# Patient Record
Sex: Female | Born: 1950 | Race: White | Hispanic: No | Marital: Married | Smoking: Never smoker
Health system: Southern US, Community
[De-identification: ages and names within clinical notes are randomized; demographics above are authoritative.]

## PROBLEM LIST (undated history)

## (undated) DIAGNOSIS — Q632 Ectopic kidney: Secondary | ICD-10-CM

## (undated) DIAGNOSIS — N926 Irregular menstruation, unspecified: Secondary | ICD-10-CM

## (undated) DIAGNOSIS — D649 Anemia, unspecified: Secondary | ICD-10-CM

## (undated) DIAGNOSIS — S0300XA Dislocation of jaw, unspecified side, initial encounter: Secondary | ICD-10-CM

## (undated) DIAGNOSIS — N6009 Solitary cyst of unspecified breast: Secondary | ICD-10-CM

## (undated) DIAGNOSIS — Z973 Presence of spectacles and contact lenses: Secondary | ICD-10-CM

## (undated) DIAGNOSIS — M8580 Other specified disorders of bone density and structure, unspecified site: Secondary | ICD-10-CM

## (undated) HISTORY — DX: Dislocation of jaw, unspecified side, initial encounter: S03.00XA

## (undated) HISTORY — DX: Anemia, unspecified: D64.9

## (undated) HISTORY — DX: Other specified disorders of bone density and structure, unspecified site: M85.80

## (undated) HISTORY — DX: Irregular menstruation, unspecified: N92.6

## (undated) HISTORY — DX: Ectopic kidney: Q63.2

## (undated) HISTORY — PX: COLONOSCOPY: SHX174

## (undated) HISTORY — DX: Solitary cyst of unspecified breast: N60.09

---

## 1987-08-09 DIAGNOSIS — S0300XA Dislocation of jaw, unspecified side, initial encounter: Secondary | ICD-10-CM

## 1987-08-09 HISTORY — PX: TEMPOROMANDIBULAR JOINT ARTHROPLASTY: SUR76

## 1987-08-09 HISTORY — DX: Dislocation of jaw, unspecified side, initial encounter: S03.00XA

## 1994-01-06 DIAGNOSIS — D649 Anemia, unspecified: Secondary | ICD-10-CM

## 1994-01-06 HISTORY — DX: Anemia, unspecified: D64.9

## 1997-04-08 DIAGNOSIS — N6009 Solitary cyst of unspecified breast: Secondary | ICD-10-CM

## 1997-04-08 HISTORY — DX: Solitary cyst of unspecified breast: N60.09

## 1998-06-01 ENCOUNTER — Other Ambulatory Visit: Admission: RE | Admit: 1998-06-01 | Discharge: 1998-06-01 | Payer: Self-pay | Admitting: Obstetrics and Gynecology

## 1999-06-17 ENCOUNTER — Other Ambulatory Visit: Admission: RE | Admit: 1999-06-17 | Discharge: 1999-06-17 | Payer: Self-pay | Admitting: Obstetrics and Gynecology

## 2000-06-26 ENCOUNTER — Other Ambulatory Visit: Admission: RE | Admit: 2000-06-26 | Discharge: 2000-06-26 | Payer: Self-pay | Admitting: Obstetrics and Gynecology

## 2001-07-02 ENCOUNTER — Other Ambulatory Visit: Admission: RE | Admit: 2001-07-02 | Discharge: 2001-07-02 | Payer: Self-pay | Admitting: Obstetrics and Gynecology

## 2002-07-01 ENCOUNTER — Other Ambulatory Visit: Admission: RE | Admit: 2002-07-01 | Discharge: 2002-07-01 | Payer: Self-pay | Admitting: Obstetrics and Gynecology

## 2003-07-07 ENCOUNTER — Other Ambulatory Visit: Admission: RE | Admit: 2003-07-07 | Discharge: 2003-07-07 | Payer: Self-pay | Admitting: Obstetrics and Gynecology

## 2003-09-08 ENCOUNTER — Ambulatory Visit (HOSPITAL_COMMUNITY): Admission: RE | Admit: 2003-09-08 | Discharge: 2003-09-08 | Payer: Self-pay | Admitting: Gastroenterology

## 2004-07-08 ENCOUNTER — Other Ambulatory Visit: Admission: RE | Admit: 2004-07-08 | Discharge: 2004-07-08 | Payer: Self-pay | Admitting: Obstetrics and Gynecology

## 2005-07-12 ENCOUNTER — Other Ambulatory Visit: Admission: RE | Admit: 2005-07-12 | Discharge: 2005-07-12 | Payer: Self-pay | Admitting: Obstetrics and Gynecology

## 2006-02-23 ENCOUNTER — Encounter: Admission: RE | Admit: 2006-02-23 | Discharge: 2006-03-30 | Payer: Self-pay | Admitting: Family Medicine

## 2006-05-17 ENCOUNTER — Ambulatory Visit (HOSPITAL_BASED_OUTPATIENT_CLINIC_OR_DEPARTMENT_OTHER): Admission: RE | Admit: 2006-05-17 | Discharge: 2006-05-17 | Payer: Self-pay | Admitting: General Surgery

## 2006-05-17 ENCOUNTER — Encounter (INDEPENDENT_AMBULATORY_CARE_PROVIDER_SITE_OTHER): Payer: Self-pay | Admitting: *Deleted

## 2006-07-13 ENCOUNTER — Other Ambulatory Visit: Admission: RE | Admit: 2006-07-13 | Discharge: 2006-07-13 | Payer: Self-pay | Admitting: Obstetrics & Gynecology

## 2007-07-16 ENCOUNTER — Other Ambulatory Visit: Admission: RE | Admit: 2007-07-16 | Discharge: 2007-07-16 | Payer: Self-pay | Admitting: Obstetrics and Gynecology

## 2008-07-08 DIAGNOSIS — M8580 Other specified disorders of bone density and structure, unspecified site: Secondary | ICD-10-CM

## 2008-07-08 HISTORY — DX: Other specified disorders of bone density and structure, unspecified site: M85.80

## 2008-07-16 ENCOUNTER — Other Ambulatory Visit: Admission: RE | Admit: 2008-07-16 | Discharge: 2008-07-16 | Payer: Self-pay | Admitting: Obstetrics and Gynecology

## 2010-12-24 NOTE — Op Note (Signed)
NAME:  Williams, Sara                         ACCOUNT NO.:  [id number]   MEDICAL RECORD NO.:  [id number]                   PATIENT TYPE:  AMB   LOCATION:  ENDO                                 FACILITY:  [facility]   PHYSICIAN:  Petra Kuba, M.D.                 DATE OF BIRTH:  10-03-1950   DATE OF PROCEDURE:  09/08/2003  DATE OF DISCHARGE:                                 OPERATIVE REPORT   PROCEDURE:  Colonoscopy.   ENDOSCOPIST:  Petra Kuba, M.D.   INDICATIONS FOR PROCEDURE:  A patient with abdominal pain, abnormal loop on  small bowel series.  Due for a colonic screening.   INFORMED CONSENT:  A consent was signed after the risks, benefits, methods  and options were thoroughly discussed in the office.   MEDICINES USED:  Demerol 75 mg, Versed 6 mg.   DESCRIPTION OF PROCEDURE:  A rectal inspection was pertinent for external  hemorrhoids, small.  A digital exam is negative.  The pediatric video  adjustable colonoscope was inserted and easily advanced around the colon to  the cecum.  This did require some abdominal pressure, mostly on her right  side, but no position changes.  No obvious abnormalities were seen on  insertion.  The cecum was identified by the appendiceal orifice and the  ileocecal valve.  To advanced into the terminal ileum required rolling her  on her back.  The TI on brief evaluation was normal.  We did have trouble  maintaining adequate position there.  We elected to withdraw.  The prep was  adequate.  There was some liquid stool that required washing and suctioning,  but on slow withdrawal back to the rectum, no abnormalities were seen;  specifically, no polyps, tumors, masses, or diverticula.  Once back in the  rectum, anorectal posterior in retroflexion confirmed some small  hemorrhoids.  The scope was reinserted a short ways up the left side of the  colon.  Air was suctioned.  The scope was removed.  The patient tolerated the procedure well.  There was no  obvious immediate  complication.   ENDOSCOPIC ASSESSMENT:  1. Internal and external hemorrhoids.  2. Otherwise within normal limits to the terminal ileum.   PLAN:  Re-screen in five to 10 years.  A trial of Levbid.  I will see her  back in six weeks.  Probably needs a laparoscope next.  Will let her contact  Dr. Laqueta Linden to possibly set that up.  I will be happy to see her back  sooner p.r.n.  As an aside, she did have a red streak from her Demerol or  Versed with some minimal itching, but no frank rash or any wheezing or other  problems.  Petra Kuba, M.D.    MEM/MEDQ  D:  09/08/2003  T:  09/08/2003  Job:  [id number]   cc:   Vikki Ports, M.D.  [address]  [place]  [place] [address]  Fax: [phone]   Laqueta Linden, M.D.  [address]  [place]  [place] [address]  Fax: [phone]

## 2010-12-24 NOTE — Op Note (Signed)
NAME:  Sara Williams, Sara Williams             ACCOUNT NO.:  [id number]   MEDICAL RECORD NO.:  [id number]          PATIENT TYPE:  AMB   LOCATION:  DSC                          FACILITY:  [facility]   PHYSICIAN:  Ollen Gross. Vernell Morgans, M.D. DATE OF BIRTH:  12/01/1950   DATE OF PROCEDURE:  05/17/2006  DATE OF DISCHARGE:  05/17/2006                               OPERATIVE REPORT   PREOPERATIVE DIAGNOSIS:  Skin lesions with atypia in the right thigh and  left foot.   POSTOPERATIVE DIAGNOSIS:  Skin lesions with atypia in the right thigh  and left foot.   PROCEDURE:  Excision of lesions on the right thigh and left foot.   SURGEON:  Ollen Gross. Carolynne Edouard, M.D.   ANESTHESIA:  Local, with IV sedation.   PROCEDURE:  After informed consent was obtained, the patient was brought  to the operating room and placed in the supine position on the operating  table.  Once some IV sedation had been given, the patient's right thigh  and left foot were prepped with Betadine and draped in the usual sterile  manner.  Each of these areas was then infiltrated with 1% lidocaine with  epinephrine.  A vertically oriented elliptical incision was then made to  excise each of the old scars in these areas.  This was done full  thickness with a 15 blade knife down to the subcutaneous fat.  Each of  these lesions was then oriented with a stitch on the distal segment and  sent to pathology.  The right thigh wound was closed with a running 3-0  nylon stitch, and the left foot wound was closed with a running 4-0  nylon stitch.  Antibiotic ointment and sterile dressings were applied.  The patient tolerated the procedure well.  At the end of the case, all  needle, sponge, and instrument counts were correct.  The patient also  had a lesion in the right web space of the fourth and fifth toes which  was being excised by Dr. Orson Slick during the time that I was doing the  other two areas, and his portion of the case will be dictated  separately.  The  patient was then taken to the recovery room in stable  condition.      Ollen Gross. Vernell Morgans, M.D.  Electronically Signed     PST/MEDQ  D:  05/22/2006  T:  05/22/2006  Job:  [id number]

## 2011-10-14 LAB — HM MAMMOGRAPHY: HM Mammogram: NORMAL

## 2012-02-23 ENCOUNTER — Other Ambulatory Visit: Payer: Self-pay | Admitting: Dermatology

## 2012-11-14 ENCOUNTER — Other Ambulatory Visit: Payer: Self-pay | Admitting: Nurse Practitioner

## 2012-12-17 ENCOUNTER — Encounter: Payer: Self-pay | Admitting: *Deleted

## 2012-12-18 ENCOUNTER — Encounter: Payer: Self-pay | Admitting: Nurse Practitioner

## 2012-12-18 ENCOUNTER — Ambulatory Visit (INDEPENDENT_AMBULATORY_CARE_PROVIDER_SITE_OTHER): Payer: BC Managed Care – PPO | Admitting: Nurse Practitioner

## 2012-12-18 VITALS — BP 108/66 | HR 72 | Resp 12 | Ht 65.0 in | Wt 137.2 lb

## 2012-12-18 DIAGNOSIS — Z01419 Encounter for gynecological examination (general) (routine) without abnormal findings: Secondary | ICD-10-CM

## 2012-12-18 DIAGNOSIS — A499 Bacterial infection, unspecified: Secondary | ICD-10-CM

## 2012-12-18 DIAGNOSIS — B9689 Other specified bacterial agents as the cause of diseases classified elsewhere: Secondary | ICD-10-CM

## 2012-12-18 DIAGNOSIS — Z0000 Encounter for general adult medical examination without abnormal findings: Secondary | ICD-10-CM

## 2012-12-18 DIAGNOSIS — N760 Acute vaginitis: Secondary | ICD-10-CM

## 2012-12-18 LAB — POCT URINALYSIS DIPSTICK
Leukocytes, UA: NEGATIVE
Spec Grav, UA: 1.02
pH, UA: 6.5

## 2012-12-18 LAB — POCT WET PREP (WET MOUNT)

## 2012-12-18 MED ORDER — ESTRADIOL-NORETHINDRONE ACET 0.05-0.14 MG/DAY TD PTTW: 1.0000 | MEDICATED_PATCH | TRANSDERMAL | Status: DC

## 2012-12-18 MED ORDER — METRONIDAZOLE 0.75 % VA GEL: 1.0000 | Freq: Every day | VAGINAL | Status: DC

## 2012-12-18 NOTE — Patient Instructions (Addendum)

## 2012-12-18 NOTE — Progress Notes (Signed)
62 y.o. G3P3 Married Caucasian Fe here for annual exam.  She feels well except has noted an increase in vaso symptoms maybe for 2-3 months.  States not relationship to stressors.  She is very compliant to changing her hormone patch as she should.  She has also noted an increase of vaginal discharge that is yellow, 'sticky', without odor or discomfort on off for about 8-10 months.  No difference with sexually activity or exercise. No itching or burning.  Patient's last menstrual period was 08/20/2005.          Sexually active: yes  The current method of family planning is post menopausal status.    Exercising: yes  walking  Smoker:  no  Health Maintenance: Pap:  10/12/2011  Normal with negative HR HPV MMG:  10/14/11 normal next one is scheduled Colonoscopy:  02/2010 ? colitis Dr. Ewing Schlein recheck 5 -10 years BMD:   10/2010 T Score: spine -1.8; left hip neck-2.1 TDaP: 09/15/2010 Labs: Hgb-12.5   reports that she has never smoked. She has never used smokeless tobacco. She reports that she does not drink alcohol or use illicit drugs.  Past Medical History  Diagnosis Date  . Anemia 01/1994  . Breast cyst   . Irregular menses   . Pelvic kidney     right sided  . Osteoporosis   . TMJ (dislocation of temporomandibular joint)     Past Surgical History  Procedure Laterality Date  . Colonoscopy      Current Outpatient Prescriptions  Medication Sig Dispense Refill  . COMBIPATCH 0.05-0.14 MG/DAY Place 0.05 patches onto the skin.      . Nutritional Supplements (JUICE PLUS FIBRE PO) Take by mouth daily.       No current facility-administered medications for this visit.    History reviewed. No pertinent family history.  ROS:  Pertinent items are noted in HPI.  Otherwise, a comprehensive ROS was negative.  Exam:   BP 108/66  Pulse 72  Resp 12  Ht 5\' 5"  (1.651 m)  Wt 137 lb 3.2 oz (62.234 kg)  BMI 22.83 kg/m2  LMP 08/20/2005 Height: 5\' 5"  (165.1 cm)  Ht Readings from Last 3 Encounters:   12/18/12 5\' 5"  (1.651 m)    General appearance: alert, cooperative and appears stated age Head: Normocephalic, without obvious abnormality, atraumatic Neck: no adenopathy, supple, symmetrical, trachea midline and thyroid normal to inspection and palpation Lungs: clear to auscultation bilaterally Breasts: normal appearance, no masses or tenderness Heart: regular rate and rhythm Abdomen: soft, non-tender; no masses,  no organomegaly Extremities: extremities normal, atraumatic, no cyanosis or edema Skin: Skin color, texture, turgor normal. No rashes or lesions Lymph nodes: Cervical, supraclavicular, and axillary nodes normal. No abnormal inguinal nodes palpated Neurologic: Grossly normal   Pelvic: External genitalia:  no lesions              Urethra:  normal appearing urethra with no masses, tenderness or lesions              Bartholin's and Skene's: normal                 Vagina: normal appearing vagina with normal color and thin yellow discharge, no lesions              Cervix: anteverted              Pap taken: no  Wet prep: + clue cells, Ph 5.5 Bimanual Exam:  Uterus:  normal size, contour, position, consistency, mobility, non-tender  Adnexa: no mass, fullness, tenderness               Rectovaginal: Confirms               Anus:  normal sphincter tone, no lesions  A:  Well Woman with normal exam  Postmenopausal on HRT  Increase in vaso symptoms  Bacterial Vaginitis  Osteopenia    P:   Pap smear as per guidelines   Mammogram as scheduled  Patient will follow with PCP about rechecking TSH and glucose  Refill on Combipatch for 1 year  Discussed rationale for HRT, risk and benefits including DVT, CVA, heart, and cancer risk, patient feels  she wants to continue for quality of life issues  RX: Metrogel for BV, if symptoms not better to call back  counseled on adequate intake of calcium and vitamin D, diet and exercise  return annually or prn  An After Visit  Summary was printed and given to the patient.

## 2012-12-19 NOTE — Progress Notes (Signed)
Encounter reviewed by Dr. Finley Chevez Silva.  

## 2013-06-17 ENCOUNTER — Encounter: Payer: Self-pay | Admitting: Nurse Practitioner

## 2013-06-17 ENCOUNTER — Ambulatory Visit (INDEPENDENT_AMBULATORY_CARE_PROVIDER_SITE_OTHER): Payer: BC Managed Care – PPO | Admitting: Nurse Practitioner

## 2013-06-17 ENCOUNTER — Telehealth: Payer: Self-pay | Admitting: Nurse Practitioner

## 2013-06-17 VITALS — BP 94/60 | HR 72 | Temp 98.8°F | Ht 64.75 in | Wt 138.0 lb

## 2013-06-17 DIAGNOSIS — N760 Acute vaginitis: Secondary | ICD-10-CM

## 2013-06-17 DIAGNOSIS — N950 Postmenopausal bleeding: Secondary | ICD-10-CM

## 2013-06-17 DIAGNOSIS — A499 Bacterial infection, unspecified: Secondary | ICD-10-CM

## 2013-06-17 DIAGNOSIS — B9689 Other specified bacterial agents as the cause of diseases classified elsewhere: Secondary | ICD-10-CM

## 2013-06-17 DIAGNOSIS — N841 Polyp of cervix uteri: Secondary | ICD-10-CM

## 2013-06-17 MED ORDER — CLINDAMYCIN PHOSPHATE 2 % VA CREA: 1.0000 | TOPICAL_CREAM | Freq: Every day | VAGINAL | Status: DC

## 2013-06-17 NOTE — Progress Notes (Signed)
Subjective:     Patient ID: Sara Williams, female   DOB: July 21, 1951, 62 y.o.   MRN: [id number]  Vaginal Discharge The patient's primary symptoms include a vaginal discharge. The patient's pertinent negatives include no pelvic pain. Pertinent negatives include no abdominal pain, chills, constipation, diarrhea, dysuria, fever, flank pain, frequency, hematuria, nausea, urgency or vomiting.    This 62 yo WM Fe presents with vaginal discharge that is 'sticky'.  At her AEX in May she had similar symptoms and ws treated for BV with Metrogel.  Symptoms improved and was gone for about 2 weeks.  Then after a trip to care for her mother in June again had same symptoms and used there rest of the Metrogel.  Now symptoms again that is still bothersome.  States the discharge gets dried and forms a clump at the hair and causes her skin to stick to the panties.  After pulling the sticky stuff off feels like she has a fresh cut.  At a couple of times the sticky stuff in  color is brown tar like substance.  In July she did have spotting X 3 spots without missed or late use of Combi patch.   Usually no odor but a  month ago had an odor that is floral pungent smell.  No bladder symptoms.no changes in symptoms with sexual activity  Review of Systems  Constitutional: Negative for fever, chills and fatigue.  Respiratory: Negative.   Cardiovascular: Negative.   Gastrointestinal: Negative.  Negative for nausea, vomiting, abdominal pain, diarrhea, constipation, blood in stool, abdominal distention and anal bleeding.  Genitourinary: Positive for vaginal bleeding and vaginal discharge. Negative for dysuria, urgency, frequency, hematuria, flank pain, decreased urine volume, pelvic pain and dyspareunia.       On HRT since 06/2002 with current Combi patch 0.05/0.14 mg since 12/06.  Musculoskeletal: Negative.   Skin: Negative.   Neurological: Negative.   Psychiatric/Behavioral: Negative.        Objective:   Physical Exam   Constitutional: She is oriented to person, place, and time. She appears well-developed and well-nourished.  Abdominal: Soft. She exhibits no distension and no mass. There is no tenderness. There is no rebound and no guarding.  Genitourinary:  External irritation without specific lesions. There is a clump of dried brown discharge in the hair.  Vaginal discharge is yellow tinged without cottage cheese appearance.  After wiping several times at the cervix - there is a presence of an endocervical polyp about 5 mm in size.  No active bleeding. No pain on bimanual.  Wet prep:  KOH: negative, PH 5.0; NSS: + clue and WBC.  Neurological: She is alert and oriented to person, place, and time.  Psychiatric: She has a normal mood and affect. Her behavior is normal. Judgment and thought content normal.       Assessment:     BV Consider polyp as the cause of brown tar discharge and spotting in July Consider inflammatory vaginitis as cause.    Plan:     Will treat with Cleocin vaginal cream for 10 days She will then return and see Dr. Hyacinth Meeker to look at the cervical polyp, may need PUS to further evaluate.

## 2013-06-17 NOTE — Patient Instructions (Signed)
Bacterial Vaginosis Bacterial vaginosis (BV) is a vaginal infection where the normal balance of bacteria in the vagina is disrupted. The normal balance is then replaced by an overgrowth of certain bacteria. There are several different kinds of bacteria that can cause BV. BV is the most common vaginal infection in women of childbearing age. CAUSES   The cause of BV is not fully understood. BV develops when there is an increase or imbalance of harmful bacteria.  Some activities or behaviors can upset the normal balance of bacteria in the vagina and put women at increased risk including:  Having a new sex partner or multiple sex partners.  Douching.  Using an intrauterine device (IUD) for contraception.  It is not clear what role sexual activity plays in the development of BV. However, women that have never had sexual intercourse are rarely infected with BV. Women do not get BV from toilet seats, bedding, swimming pools or from touching objects around them.  SYMPTOMS   Grey vaginal discharge.  A fish-like odor with discharge, especially after sexual intercourse.  Itching or burning of the vagina and vulva.  Burning or pain with urination.  Some women have no signs or symptoms at all. DIAGNOSIS  Your caregiver must examine the vagina for signs of BV. Your caregiver will perform lab tests and look at the sample of vaginal fluid through a microscope. They will look for bacteria and abnormal cells (clue cells), a pH test higher than 4.5, and a positive amine test all associated with BV.  RISKS AND COMPLICATIONS   Pelvic inflammatory disease (PID).  Infections following gynecology surgery.  Developing HIV.  Developing herpes virus. TREATMENT  Sometimes BV will clear up without treatment. However, all women with symptoms of BV should be treated to avoid complications, especially if gynecology surgery is planned. Female partners generally do not need to be treated. However, BV may spread  between female sex partners so treatment is helpful in preventing a recurrence of BV.   BV may be treated with antibiotics. The antibiotics come in either pill or vaginal cream forms. Either can be used with nonpregnant or pregnant women, but the recommended dosages differ. These antibiotics are not harmful to the baby.  BV can recur after treatment. If this happens, a second round of antibiotics will often be prescribed.  Treatment is important for pregnant women. If not treated, BV can cause a premature delivery, especially for a pregnant woman who had a premature birth in the past. All pregnant women who have symptoms of BV should be checked and treated.  For chronic reoccurrence of BV, treatment with a type of prescribed gel vaginally twice a week is helpful. HOME CARE INSTRUCTIONS   Finish all medication as directed by your caregiver.  Do not have sex until treatment is completed.  Tell your sexual partner that you have a vaginal infection. They should see their caregiver and be treated if they have problems, such as a mild rash or itching.  Practice safe sex. Use condoms. Only have 1 sex partner. PREVENTION  Basic prevention steps can help reduce the risk of upsetting the natural balance of bacteria in the vagina and developing BV:  Do not have sexual intercourse (be abstinent).  Do not douche.  Use all of the medicine prescribed for treatment of BV, even if the signs and symptoms go away.  Tell your sex partner if you have BV. That way, they can be treated, if needed, to prevent reoccurrence. SEEK MEDICAL CARE IF:     Your symptoms are not improving after 3 days of treatment.  You have increased discharge, pain, or fever. MAKE SURE YOU:   Understand these instructions.  Will watch your condition.  Will get help right away if you are not doing well or get worse. FOR MORE INFORMATION  Division of STD Prevention (DSTDP), Centers for Disease Control and Prevention:  www.cdc.gov/std American Social Health Association (ASHA): www.ashastd.org  Document Released: 07/25/2005 Document Revised: 10/17/2011 Document Reviewed: 03/06/2013 ExitCare Patient Information 2014 [employer].  

## 2013-06-17 NOTE — Telephone Encounter (Signed)
Message left to return call to Sammie Schermerhorn at [phone].    

## 2013-06-17 NOTE — Telephone Encounter (Signed)
Patient calling re: she has been using ointment for vaginal infection since March. At first it helped but the problem keeps coming back. Patient declined appointment as she wants to speak with nurse first.  Karin Golden [phone]

## 2013-06-17 NOTE — Telephone Encounter (Signed)
Patient c/o "sticky" vaginal dc. Has not really improved since last ov.  Scheduled appointment. Patient agreeable.   Routing to provider for final review. Patient agreeable to disposition. Will close encounter

## 2013-06-20 NOTE — Addendum Note (Signed)
Addended by: Ria Comment R on: 06/20/2013 01:16 PM   Modules accepted: Orders

## 2013-06-20 NOTE — Progress Notes (Signed)
Reviewed personally.  M. Suzanne Bridgette Wolden, MD.  

## 2013-06-25 ENCOUNTER — Other Ambulatory Visit: Payer: Self-pay | Admitting: Obstetrics & Gynecology

## 2013-06-25 DIAGNOSIS — N950 Postmenopausal bleeding: Secondary | ICD-10-CM

## 2013-06-27 NOTE — Addendum Note (Signed)
Addended by: Ria Comment R on: 06/27/2013 01:26 PM   Modules accepted: Orders

## 2013-07-08 ENCOUNTER — Encounter: Payer: Self-pay | Admitting: Obstetrics & Gynecology

## 2013-07-08 ENCOUNTER — Ambulatory Visit (INDEPENDENT_AMBULATORY_CARE_PROVIDER_SITE_OTHER): Payer: BC Managed Care – PPO | Admitting: Obstetrics & Gynecology

## 2013-07-08 VITALS — BP 100/60 | HR 64 | Resp 16 | Ht 64.75 in | Wt 139.0 lb

## 2013-07-08 DIAGNOSIS — N898 Other specified noninflammatory disorders of vagina: Secondary | ICD-10-CM

## 2013-07-08 NOTE — Progress Notes (Signed)
Subjective:     Patient ID: Sara Williams, female   DOB: 06-08-1951, 62 y.o.   MRN: [id number]  HPI 62 yo MWF here for several year h/o vaginal discharge.  Discharge is cyclical.  Will be very sticky with some odor for a few weeks, can clear up for a day or two, and then cycles again.  Has had some gushes of fluid.  Denies bleeding.  Has been treated with Metrogel and Cleocin.  After the metrogel, 2 times, the discharge was gone for about 2 weeks.  Now, no discharge after the Cleocin.    Has been on Combipatch for about 10 years.  Primarily on the 50/140 dosage.    Review of Systems  All other systems reviewed and are negative.       Objective:   Physical Exam  Constitutional: She is oriented to person, place, and time. She appears well-developed and well-nourished.  Abdominal: Soft. Bowel sounds are normal.  Genitourinary: Vagina normal and uterus normal. There is no rash or tenderness on the right labia. There is no rash on the left labia. Uterus is not enlarged, not fixed and not tender. Cervix exhibits no motion tenderness, no discharge (and no polyp noted) and no friability. No vaginal discharge found.  Neurological: She is alert and oriented to person, place, and time.  Skin: Skin is warm and dry.  Psychiatric: She has a normal mood and affect.       Assessment:     Recurrent vaginal discharge, currently resolved Cervical polyp noted at last exam, not seen today     Plan:     For now, pt will monitor Cancel PUS for now Pt to see ME if has recurrent issues.  I will do Affirm test then and also start vaginal estrogen.

## 2013-07-11 ENCOUNTER — Other Ambulatory Visit: Payer: BC Managed Care – PPO | Admitting: Obstetrics & Gynecology

## 2013-07-11 ENCOUNTER — Other Ambulatory Visit: Payer: BC Managed Care – PPO

## 2013-07-31 ENCOUNTER — Encounter: Payer: Self-pay | Admitting: Nurse Practitioner

## 2013-09-19 ENCOUNTER — Other Ambulatory Visit: Payer: Self-pay | Admitting: Family Medicine

## 2013-09-19 DIAGNOSIS — M792 Neuralgia and neuritis, unspecified: Secondary | ICD-10-CM

## 2013-09-26 ENCOUNTER — Ambulatory Visit
Admission: RE | Admit: 2013-09-26 | Discharge: 2013-09-26 | Disposition: A | Discharge status: Home / Self Care | Payer: BC Managed Care – PPO | Source: Ambulatory Visit | Attending: Family Medicine | Admitting: Family Medicine

## 2013-09-26 DIAGNOSIS — M792 Neuralgia and neuritis, unspecified: Secondary | ICD-10-CM

## 2013-11-06 ENCOUNTER — Telehealth: Payer: Self-pay | Admitting: Obstetrics & Gynecology

## 2013-11-06 NOTE — Telephone Encounter (Signed)
Patient said she is returning a phone call from 4 mths ago this is when dr Sabra Heck told her to call back.  Has been having some discharge has been treated two times by patty and once by dr Sabra Heck. Dr Sabra Heck told her to call if the discharge started again. It started about 2 weeks ago.

## 2013-11-06 NOTE — Telephone Encounter (Signed)
Spoke with patient. Patient states that she is having vaginal discharge that is "Very sticky and gummy with a clear rush like I have had before." Symptoms began two weeks ago. Denies odor, abdominal pain, and fevers but states she is experiencing some itchiness. Has been treated twice with metrogel and once with cleocin which she states got rid of her symptoms until two weeks ago. Last office visit was with Dr.Miller who advised patient to call back if symptoms reoccur. According to 07/08/2013 office visit notes from [place] she would like to see patient to do affirm test and to start patient on vaginal estrogen. Patient requesting afternoon appointment on a Tuesday due to schedule. Appointment made for 4/7 at 1430 with Dr.Miller. Patient verbalizes understanding and is agreeable.  Routing to provider for final review. Patient agreeable to disposition. Will close encounter

## 2013-11-12 ENCOUNTER — Ambulatory Visit (INDEPENDENT_AMBULATORY_CARE_PROVIDER_SITE_OTHER): Payer: BC Managed Care – PPO | Admitting: Obstetrics & Gynecology

## 2013-11-12 VITALS — BP 122/62 | HR 64 | Resp 16 | Ht 64.75 in | Wt 140.0 lb

## 2013-11-12 DIAGNOSIS — N898 Other specified noninflammatory disorders of vagina: Secondary | ICD-10-CM

## 2013-11-12 MED ORDER — ESTRADIOL 0.1 MG/GM VA CREA: TOPICAL_CREAM | VAGINAL | Status: DC

## 2013-11-12 NOTE — Patient Instructions (Signed)
Atrophic Vaginitis Atrophic vaginitis is a problem of low levels of estrogen in women. This problem can happen at any age. It is most common in women who have gone through menopause ("the change").  HOW WILL I KNOW IF I HAVE THIS PROBLEM? You may have:  Trouble with peeing (urinating), such as:  Going to the bathroom often.  A hard time holding your pee until you reach a bathroom.  Leaking pee.  Having pain when you pee.  Itching or a burning feeling.  Vaginal bleeding and spotting.  Pain during sex.  Dryness of the vagina.  A yellow, bad-smelling fluid (discharge) coming from the vagina. HOW WILL MY DOCTOR CHECK FOR THIS PROBLEM?  During your exam, your doctor will likely find the problem.  If there is a vaginal fluid, it may be checked for infection. HOW WILL THIS PROBLEM BE TREATED? Keep the vulvar skin as clean as possible. Moisturizers and lubricants can help with some of the symptoms. Estrogen replacement can help. There are 2 ways to take estrogen:  Systemic estrogen gets estrogen to your whole body. It takes many weeks or months before the symptoms get better.  You take an estrogen pill.  You use a skin patch. This is a patch that you put on your skin.  If you still have your uterus, your doctor may ask you to take a hormone. Talk to your doctor about the right medicine for you.  Estrogen cream.  This puts estrogen only at the part of your body where you apply it. The cream is put into the vagina or put on the vulvar skin. For some women, estrogen cream works faster than pills or the patch. CAN ALL WOMEN WITH THIS PROBLEM USE ESTROGEN? No. Women with certain types of cancer, liver problems, or problems with blood clots should not take estrogen. Your doctor can help you decide the best treatment for your symptoms. Document Released: 01/11/2008 Document Revised: 10/17/2011 Document Reviewed: 01/11/2008 ExitCare Patient Information 2014 [employer].  

## 2013-11-12 NOTE — Progress Notes (Signed)
Subjective:     Patient ID: Sara Williams, female   DOB: 1951/06/05, 63 y.o.   MRN: [id number]  HPI 63 yo G3P3  Sometimes there is a small odor.  Not fishy smelling.  Has episodes for more liquid discharge.  This alternates between the watery discharge and the "sticky discharge".  No new sexual partner.  Has been treated for BV multiple times by NP, Edman Circle.  At last visit, no BV present.  Agreed to monitor and for pt to return to see me if issues continued.  Again, discharge is back.  No odor.  No urinary symptoms.  No pain.  No VB.  Review of Systems ROS:  As per above.  O/W complete ROS questions negative.    Objective:   Physical Exam Gen:  Alert, NAD Abd:  Soft, ND, NT, no masses Lymph:  No inguinal LAD GYN:  NAEFG, vaginal erythematous with sticky discharge that is yellowish, normal urethral, normal Bartholin's and Skene's glands, cervix normal, uterus normal.  No pelvic masses.  Wet smear:  Ph 4.5.  KOH: no yeast.  Saline:  No trich. No whiff.  Occ WBC, flat epithelial cells.    Assessment:     Atrophic vaginitis    Plan:     Affirm Estrace a gram vaginaly twice weekly.  Rx to pharmacy.  Pt has f/u with PG for AEX 5/15.  Advised if symptoms do not resolve, she needs to let me know.

## 2013-11-13 ENCOUNTER — Encounter: Payer: Self-pay | Admitting: Obstetrics & Gynecology

## 2013-11-13 LAB — WET PREP BY MOLECULAR PROBE
Candida species: NEGATIVE
Gardnerella vaginalis: NEGATIVE
Trichomonas vaginosis: NEGATIVE

## 2013-11-14 ENCOUNTER — Telehealth: Payer: Self-pay | Admitting: Emergency Medicine

## 2013-11-14 NOTE — Telephone Encounter (Signed)
Spoke with patient and message from Dr. Sabra Heck discussed. Questions answered and patient denies complaints. Will follow up prn.   Routing to provider for final review. Patient agreeable to disposition. Will close encounter

## 2013-11-14 NOTE — Telephone Encounter (Signed)
Message copied by Michele Mcalpine on Thu Nov 14, 2013  3:17 PM ------      Message from: Megan Salon      Created: Wed Nov 13, 2013  6:09 AM       Inform pt test was negative for yeast and BV.  I recommend she start the estrogen as we discussed and she has follow up in May with Patty.  Please ask her to call with any questions. ------

## 2013-12-19 ENCOUNTER — Ambulatory Visit: Payer: BC Managed Care – PPO | Admitting: Nurse Practitioner

## 2013-12-23 ENCOUNTER — Ambulatory Visit (INDEPENDENT_AMBULATORY_CARE_PROVIDER_SITE_OTHER): Payer: BC Managed Care – PPO | Admitting: Nurse Practitioner

## 2013-12-23 ENCOUNTER — Encounter: Payer: Self-pay | Admitting: Nurse Practitioner

## 2013-12-23 VITALS — BP 96/62 | HR 72 | Ht 64.75 in | Wt 138.0 lb

## 2013-12-23 DIAGNOSIS — Z01419 Encounter for gynecological examination (general) (routine) without abnormal findings: Secondary | ICD-10-CM

## 2013-12-23 DIAGNOSIS — N952 Postmenopausal atrophic vaginitis: Secondary | ICD-10-CM

## 2013-12-23 DIAGNOSIS — Z0000 Encounter for general adult medical examination without abnormal findings: Secondary | ICD-10-CM

## 2013-12-23 DIAGNOSIS — R8290 Unspecified abnormal findings in urine: Secondary | ICD-10-CM

## 2013-12-23 DIAGNOSIS — M949 Disorder of cartilage, unspecified: Secondary | ICD-10-CM

## 2013-12-23 DIAGNOSIS — M8580 Other specified disorders of bone density and structure, unspecified site: Secondary | ICD-10-CM

## 2013-12-23 DIAGNOSIS — M899 Disorder of bone, unspecified: Secondary | ICD-10-CM

## 2013-12-23 DIAGNOSIS — R82998 Other abnormal findings in urine: Secondary | ICD-10-CM

## 2013-12-23 LAB — POCT URINALYSIS DIPSTICK
BILIRUBIN UA: NEGATIVE
GLUCOSE UA: NEGATIVE
KETONES UA: NEGATIVE
Nitrite, UA: NEGATIVE
PH UA: 7
Protein, UA: NEGATIVE
RBC UA: NEGATIVE
Urobilinogen, UA: NEGATIVE

## 2013-12-23 MED ORDER — ESTRADIOL-NORETHINDRONE ACET 0.05-0.14 MG/DAY TD PTTW: 1.0000 | MEDICATED_PATCH | TRANSDERMAL | Status: DC

## 2013-12-23 MED ORDER — ESTRADIOL 0.1 MG/GM VA CREA: TOPICAL_CREAM | VAGINAL | Status: DC

## 2013-12-23 NOTE — Patient Instructions (Signed)

## 2013-12-23 NOTE — Progress Notes (Signed)
Patient ID: Sara Williams, female   DOB: 06/30/1951, 63 y.o.   MRN: [id number] 63 y.o. G3P3 Married Caucasian Fe here for annual exam.  She has been seen by Dr. Sabra Heck and myself for persistent vaginal discharge.  She has been treated for BV and yeast and symptoms do improve.  Now on Estrace vaginal cream twice weekly.  Vaginal discharge still present, still 'sticky' at times.  Maybe a little less. No itching burning.  Occasional odor.  Patient's last menstrual period was 08/20/2005.          Sexually active: yes  The current method of family planning is post menopausal status.    Exercising: yes  walking and [employer] Smoker:  no  Health Maintenance: Pap:  10/12/2011  WNL, neg HR HPV MMG: 12/24/12, Bi Rads 1: negative  Colonoscopy:  02/2010 ? colitis Dr. Watt Climes recheck 5 -10 years BMD:   10/2010 T Score: spine -1.8; left hip neck-2.1 TDaP: 09/15/2010 Labs:  HB:  PCP Urine:  1+ leuk's   reports that she has never smoked. She has never used smokeless tobacco. She reports that she does not drink alcohol or use illicit drugs.  Past Medical History  Diagnosis Date  . Anemia 01/1994  . Breast cyst 9/98    aspiration  . Irregular menses   . Pelvic kidney     right sided  . 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 Osteopenia 12/09    Past Surgical History  Procedure Laterality Date  . Colonoscopy    . Temporomandibular joint arthroplasty  1989    Current Outpatient Prescriptions  Medication Sig Dispense Refill  . estradiol (ESTRACE) 0.1 MG/GM vaginal cream 1 gram vaginally on Monday and Friday and 1/2 gm on Wednesday  42.5 g  4  . estradiol-norethindrone (COMBIPATCH) 0.05-0.14 MG/DAY Place 1 patch onto the skin 2 (two) times a week.  8 patch  12  . vitamin B-12 (CYANOCOBALAMIN) 1000 MCG tablet Take 1,000 mcg by mouth daily.       No current facility-administered medications for this visit.    Family History  Problem Relation Age of Onset  . Heart failure Maternal  Grandfather     ROS:  Pertinent items are noted in HPI.  Otherwise, a comprehensive ROS was negative.  Exam:   BP 96/62  Pulse 72  Ht 5' 4.75" (1.645 m)  Wt 138 lb (62.596 kg)  BMI 23.13 kg/m2  LMP 08/20/2005 Height: 5' 4.75" (164.5 cm)  Ht Readings from Last 3 Encounters:  12/23/13 5' 4.75" (1.645 m)  11/12/13 5' 4.75" (1.645 m)  07/08/13 5' 4.75" (1.645 m)    General appearance: alert, cooperative and appears stated age Head: Normocephalic, without obvious abnormality, atraumatic Neck: no adenopathy, supple, symmetrical, trachea midline and thyroid normal to inspection and palpation Lungs: clear to auscultation bilaterally Breasts: normal appearance, no masses or tenderness Heart: regular rate and rhythm Abdomen: soft, non-tender; no masses,  no organomegaly Extremities: extremities normal, atraumatic, no cyanosis or edema Skin: Skin color, texture, turgor normal. No rashes or lesions Lymph nodes: Cervical, supraclavicular, and axillary nodes normal. No abnormal inguinal nodes palpated Neurologic: Grossly normal   Pelvic: External genitalia:  no lesions              Urethra:  normal appearing urethra with no masses, tenderness or lesions              Bartholin's and Skene's: normal  Vagina: atrophic appearing vagina with pale color and discharge, no lesions              Cervix: anteverted              Pap taken: no Bimanual Exam:  Uterus:  normal size, contour, position, consistency, mobility, non-tender              Adnexa: no mass, fullness, tenderness               Rectovaginal: Confirms               Anus:  normal sphincter tone, no lesions  A:  Well Woman with normal exam  Postmenopausal on HRT  Atrophic vaginitis on vaginal E'  R/O UTI - asymptomatic  Osteopenia - stable  P:   Reviewed health and wellness pertinent to exam  Pap smear not taken today  Mammogram is due later this month  Refill on Combipatch twice weekly for a year  Refill on  Estrace vaginal cream - but will increase to another 1/2 gm in between the regular schedule to help more with the atrophy and vaginal discharge.  Counseled with risk of DVT, CVA, cancer, etc.  Counseled on breast self exam, mammography screening, use and side effects of HRT, adequate intake of calcium and vitamin D, diet and exercise return annually or prn  An After Visit Summary was printed and given to the patient.

## 2013-12-25 LAB — URINE CULTURE
Colony Count: NO GROWTH
ORGANISM ID, BACTERIA: NO GROWTH

## 2013-12-25 NOTE — Progress Notes (Signed)
Reviewed personally.  Felipa Emory, MD. Would treat with 1gram pv three times weekly and recheck in 6-8 weeks.

## 2013-12-25 NOTE — Progress Notes (Signed)
Please call patient and have her to increase estrogen vaginal cream to 1 gm three times a week per Dr. Ammie Ferrier recommendation  And recheck with Dr. Sabra Heck in 6-8 weeks.

## 2013-12-26 ENCOUNTER — Telehealth: Payer: Self-pay | Admitting: *Deleted

## 2013-12-26 NOTE — Telephone Encounter (Signed)
Message copied by Graylon Good on Thu Dec 26, 2013 11:37 AM ------      Message from: Kem Boroughs R      Created: Wed Dec 25, 2013 10:58 PM                   ----- Message -----         From: Lyman Speller, MD         Sent: 12/25/2013   1:24 PM           To: Milford Cage, FNP                        ----- Message -----         From: Milford Cage, FNP         Sent: 12/23/2013   3:56 PM           To: Lyman Speller, MD             ------

## 2013-12-26 NOTE — Telephone Encounter (Signed)
Please call patient and have her to increase estrogen vaginal cream to 1 gm three times a week per Dr. Ammie Ferrier recommendation And recheck with Dr. Sabra Heck in 6-8 weeks.   I spoke with pt.  Advised of recommendation per Dr. Sabra Heck.  Pt agreeable and transferred to front desk to schedule this appt.  Pt has enough refills at this time.

## 2014-02-06 ENCOUNTER — Ambulatory Visit (INDEPENDENT_AMBULATORY_CARE_PROVIDER_SITE_OTHER): Payer: BC Managed Care – PPO | Admitting: Surgery

## 2014-02-11 ENCOUNTER — Telehealth: Payer: Self-pay | Admitting: Nurse Practitioner

## 2014-02-11 NOTE — Telephone Encounter (Signed)
Spoke with patient. Patient states that she has been using Estradiol vaginal cream 1gm three times per week since April. "I am still having symptoms and the ointment is not working. It is a lot of money and I do not want to get a refill if I do not need to. I am seeing Dr.Miller next week and if she is going to switch my medication then I do not want to pay for the refill now." Patient would like to know if she should refill medication or wait until being seen with Dr.Miller just in case there needs to be a med change. Advised would send a message over to [place] and give patient a call back with further recommendations. Patient agreeable. "I am also having a biopsy done on my breast and I want to speak with Milford Cage, FNP." Advised Milford Cage, FNP is currently seeing patient's and I can not guarantee a return phone call at any specific time but that I would let her know of request. Patient agreeable.  Routing to Dr.Miller CC: Milford Cage, FNP

## 2014-02-11 NOTE — Telephone Encounter (Signed)
Patient wants to talk with a nurse regarding an ointment. No further details given.

## 2014-02-11 NOTE — Telephone Encounter (Signed)
She can stop the cream and wait until her appointment for any new recommendations.

## 2014-02-11 NOTE — Telephone Encounter (Signed)
Spoke with the patient and she will hold off on a new RX of the Estrace Vaginal cream pending a consultation with the breast surgeon and outcome of the breast biopsy.  She is in agreement with the plan.  She is also aware if the biopsy results are positive will need to DC all HRT.

## 2014-02-12 ENCOUNTER — Ambulatory Visit (INDEPENDENT_AMBULATORY_CARE_PROVIDER_SITE_OTHER): Payer: BC Managed Care – PPO | Admitting: Surgery

## 2014-02-12 ENCOUNTER — Encounter (INDEPENDENT_AMBULATORY_CARE_PROVIDER_SITE_OTHER): Payer: Self-pay | Admitting: Surgery

## 2014-02-12 VITALS — BP 126/74 | HR 79 | Temp 98.0°F | Ht 64.0 in | Wt 138.0 lb

## 2014-02-12 DIAGNOSIS — R920 Mammographic microcalcification found on diagnostic imaging of breast: Secondary | ICD-10-CM

## 2014-02-12 NOTE — Progress Notes (Signed)
Re:   Sara Williams DOB:   09-05-1950 MRN:   [id number]  ASSESSMENT AND PLAN: 1.  Left breast microcalcifications  Microcalcification at 12 o'clock in dense breast.  Unable to do core biopsy.  I discussed proceeding with a wire localization open left breast biopsy.  I discussed this with Dr. Marcelo Baldy and her thought that a wire loc would probably be better than a seed loc.  I discussed the potential risks, including, but not limited to, bleeding, infection, nerve injury and the need for more surgery.  2.  Right breast microcalcifications  They have been there several years and no reason to biopsy these at this time.  3. Recurrent cyst right axilla.  Chief Complaint  Patient presents with  . eval elft breast   REFERRING PHYSICIAN: NNODI, ADAKU, MD  HISTORY OF PRESENT ILLNESS: Sara Williams is a 63 y.o. (DOB: 01-12-1951) [name]  female whose primary care physician is NNODI, ADAKU, MD and comes to me today for microcalcification of the left breast.  She has not prior history of breast disease. She came by herself. She sees P. Raquel Sarna, FNP, from GYN standpoint.  The patient has no family history of breast cancer.  She went through menopause around 2006.  She is on estradiol patch and uses estrace cream.  She uses the patch for hot flashes. She can feel no mass. She had mammograms at [facility] on 01/02/2014.  Dr. Isaiah Blakes saw areas at 6 o'clock on the right breast and 1 o'clock on the left breast. Follow up films by Dr. Marcelo Baldy, 01/14/2014, showed the right side to be unchanged, but the left sided microca++ appeared newer.  There was not enough area to do a biopsy, so she was referred for open biopsy.  I spoke to Dr. Marcelo Baldy who thought that wire loc would be the best loc method. Sara Williams also has some financial concerns.    Past Medical History  Diagnosis Date  . Anemia 01/1994  . Breast cyst 9/98    aspiration  . Irregular menses   . Pelvic kidney     right sided  . TMJ (dislocation  of temporomandibular joint) 1989    left  . Osteopenia 12/09    Past Surgical History  Procedure Laterality Date  . Colonoscopy    . Temporomandibular joint arthroplasty  1989    Current Outpatient Prescriptions  Medication Sig Dispense Refill  . estradiol (ESTRACE) 0.1 MG/GM vaginal cream 1 gram vaginally on Monday and Friday and 1/2 gm on Wednesday  42.5 g  4  . estradiol-norethindrone (COMBIPATCH) 0.05-0.14 MG/DAY Place 1 patch onto the skin 2 (two) times a week.  8 patch  12  . vitamin B-12 (CYANOCOBALAMIN) 1000 MCG tablet Take 1,000 mcg by mouth daily.       No current facility-administered medications for this visit.    Allergies  Allergen Reactions  . Anaprox [Naproxen Sodium]   . Aspirin   . Dolobid [Diflunisal]    REVIEW OF SYSTEMS: Skin:  No history of rash.  No history of abnormal moles. Infection:  No history of hepatitis or HIV.  No history of MRSA. Neurologic:  No history of stroke.  No history of seizure.  No history of headaches. Cardiac:  No history of hypertension. No history of heart disease.  No history of prior cardiac catheterization.  No history of seeing a cardiologist. Pulmonary:  Does not smoke cigarettes.  No asthma or bronchitis.  No OSA/CPAP.  Endocrine:  No diabetes. No thyroid disease. Gastrointestinal:  No history of stomach disease.  No history of liver disease.  No history of gall bladder disease.  No history of pancreas disease.  Colonoscopy by Dr. Watt Climes in 2012. Urologic:  Pelvic kidney. Musculoskeletal:  No history of joint or back disease. Hematologic:  No bleeding disorder.  No history of anemia.  Not anticoagulated. Psycho-social:  The patient is oriented.   The patient has no obvious psychologic or social impairment to understanding our conversation and plan.  SOCIAL and FAMILY HISTORY: Married.  Retired Control and instrumentation engineer. Has 3 children and 9 grandchildren.  PHYSICAL EXAM: BP 126/74  Pulse 79  Temp(Src) 98 F (36.7 C)  Ht 5'  4" (1.626 m)  Wt 138 lb (62.596 kg)  BMI 23.68 kg/m2  LMP 08/20/2005  General: WN older WF who is alert and generally healthy appearing.  HEENT: Normal. Pupils equal. Neck: Supple. No mass.  No thyroid mass. Lymph Nodes:  No supraclavicular or cervical or axillary nodes.  She does have a 1.5 cm cyst of her right axilla (this was excised about 30 years ago) Lungs: Clear to auscultation and symmetric breath sounds. Heart:  RRR. No murmur or rub. Breasts:  Right:  Dense, small, no mass  Left:  Dense, small, no obvious mass.  Abdomen: Soft. No mass. No tenderness. No hernia. Normal bowel sounds.  No abdominal scars. Rectal: Not done. Extremities:  Good strength and ROM  in upper and lower extremities. Neurologic:  Grossly intact to motor and sensory function. Psychiatric: Has normal mood and affect. Behavior is normal.   DATA REVIEWED: Notes from [facility], MD,  [facility], [address]  [place], [place]    [place] Phone:  [place]:  [phone]

## 2014-02-14 ENCOUNTER — Telehealth: Payer: Self-pay | Admitting: Nurse Practitioner

## 2014-02-14 NOTE — Telephone Encounter (Signed)
Called patient to see the outcome of the consult with the breast surgeon Dr.David Lucia Gaskins.  His notes said they are going to do needle wire localization and that has not yet been scheduled. No mention about use of HRT or Estrogen cream.  I called the patient and she said he stated to stop all of this right now.  She canceled appointment with Dr. Sabra Heck for a recheck for next week.

## 2014-02-21 ENCOUNTER — Ambulatory Visit: Payer: BC Managed Care – PPO | Admitting: Obstetrics & Gynecology

## 2014-03-17 ENCOUNTER — Encounter (HOSPITAL_BASED_OUTPATIENT_CLINIC_OR_DEPARTMENT_OTHER): Payer: Self-pay | Admitting: *Deleted

## 2014-03-17 NOTE — Progress Notes (Signed)
No labs needed-has stopped hrt

## 2014-03-20 ENCOUNTER — Other Ambulatory Visit: Payer: Self-pay | Admitting: Dermatology

## 2014-03-24 ENCOUNTER — Encounter (HOSPITAL_BASED_OUTPATIENT_CLINIC_OR_DEPARTMENT_OTHER): Payer: Self-pay | Admitting: Anesthesiology

## 2014-03-24 ENCOUNTER — Encounter (HOSPITAL_BASED_OUTPATIENT_CLINIC_OR_DEPARTMENT_OTHER): Payer: BC Managed Care – PPO | Admitting: Anesthesiology

## 2014-03-24 ENCOUNTER — Encounter (HOSPITAL_BASED_OUTPATIENT_CLINIC_OR_DEPARTMENT_OTHER)
Admission: RE | Disposition: A | Discharge status: Home / Self Care | Payer: Self-pay | Source: Ambulatory Visit | Attending: Surgery

## 2014-03-24 ENCOUNTER — Ambulatory Visit (HOSPITAL_BASED_OUTPATIENT_CLINIC_OR_DEPARTMENT_OTHER): Payer: BC Managed Care – PPO | Admitting: Anesthesiology

## 2014-03-24 ENCOUNTER — Ambulatory Visit (HOSPITAL_BASED_OUTPATIENT_CLINIC_OR_DEPARTMENT_OTHER)
Admission: RE | Admit: 2014-03-24 | Discharge: 2014-03-24 | Disposition: A | Discharge status: Home / Self Care | Payer: BC Managed Care – PPO | Source: Ambulatory Visit | Attending: Surgery | Admitting: Surgery

## 2014-03-24 DIAGNOSIS — Z888 Allergy status to other drugs, medicaments and biological substances status: Secondary | ICD-10-CM | POA: Insufficient documentation

## 2014-03-24 DIAGNOSIS — N6019 Diffuse cystic mastopathy of unspecified breast: Secondary | ICD-10-CM | POA: Diagnosis not present

## 2014-03-24 DIAGNOSIS — Z886 Allergy status to analgesic agent status: Secondary | ICD-10-CM | POA: Insufficient documentation

## 2014-03-24 DIAGNOSIS — Z885 Allergy status to narcotic agent status: Secondary | ICD-10-CM | POA: Diagnosis not present

## 2014-03-24 DIAGNOSIS — D249 Benign neoplasm of unspecified breast: Secondary | ICD-10-CM

## 2014-03-24 DIAGNOSIS — N6089 Other benign mammary dysplasias of unspecified breast: Secondary | ICD-10-CM | POA: Diagnosis not present

## 2014-03-24 DIAGNOSIS — L905 Scar conditions and fibrosis of skin: Secondary | ICD-10-CM | POA: Diagnosis not present

## 2014-03-24 DIAGNOSIS — L748 Other eccrine sweat disorders: Secondary | ICD-10-CM | POA: Diagnosis not present

## 2014-03-24 DIAGNOSIS — R920 Mammographic microcalcification found on diagnostic imaging of breast: Secondary | ICD-10-CM | POA: Diagnosis present

## 2014-03-24 HISTORY — DX: Presence of spectacles and contact lenses: Z97.3

## 2014-03-24 HISTORY — PX: BREAST BIOPSY: SHX20

## 2014-03-24 LAB — POCT HEMOGLOBIN-HEMACUE: HEMOGLOBIN: 13.5 g/dL (ref 12.0–15.0)

## 2014-03-24 SURGERY — BREAST BIOPSY WITH NEEDLE LOCALIZATION
Anesthesia: General | Site: Breast | Laterality: Left

## 2014-03-24 MED ORDER — PROPOFOL 10 MG/ML IV BOLUS
INTRAVENOUS | Status: DC | PRN
  Administered 2014-03-24: 200 mg via INTRAVENOUS

## 2014-03-24 MED ORDER — FENTANYL CITRATE 0.05 MG/ML IJ SOLN: 50.0000 ug | INTRAMUSCULAR | Status: DC | PRN

## 2014-03-24 MED ORDER — OXYCODONE HCL 5 MG PO TABS
5.0000 mg | ORAL_TABLET | Freq: Once | ORAL | Status: AC | PRN
  Administered 2014-03-24: 5 mg via ORAL

## 2014-03-24 MED ORDER — DEXAMETHASONE SODIUM PHOSPHATE 4 MG/ML IJ SOLN
INTRAMUSCULAR | Status: DC | PRN
  Administered 2014-03-24: 10 mg via INTRAVENOUS

## 2014-03-24 MED ORDER — HYDROCODONE-ACETAMINOPHEN 5-325 MG PO TABS: 1.0000 | ORAL_TABLET | Freq: Four times a day (QID) | ORAL | Status: DC | PRN

## 2014-03-24 MED ORDER — MIDAZOLAM HCL 2 MG/2ML IJ SOLN
INTRAMUSCULAR | Status: AC
  Filled 2014-03-24: qty 2

## 2014-03-24 MED ORDER — BUPIVACAINE HCL (PF) 0.25 % IJ SOLN
INTRAMUSCULAR | Status: DC | PRN
  Administered 2014-03-24: 20 mL

## 2014-03-24 MED ORDER — ACETAMINOPHEN-CODEINE 120-12 MG/5ML PO SOLN: 10.0000 mL | ORAL | Status: DC | PRN

## 2014-03-24 MED ORDER — LACTATED RINGERS IV SOLN
INTRAVENOUS | Status: DC
  Administered 2014-03-24 (×2): via INTRAVENOUS

## 2014-03-24 MED ORDER — CHLORHEXIDINE GLUCONATE 4 % EX LIQD
1.0000 | Freq: Once | CUTANEOUS | Status: DC
Start: 2014-03-25 — End: 2014-03-24

## 2014-03-24 MED ORDER — MIDAZOLAM HCL 5 MG/5ML IJ SOLN
INTRAMUSCULAR | Status: DC | PRN
  Administered 2014-03-24: 2 mg via INTRAVENOUS

## 2014-03-24 MED ORDER — HYDROMORPHONE HCL PF 1 MG/ML IJ SOLN: 0.2500 mg | INTRAMUSCULAR | Status: DC | PRN

## 2014-03-24 MED ORDER — FENTANYL CITRATE 0.05 MG/ML IJ SOLN
INTRAMUSCULAR | Status: AC
  Filled 2014-03-24: qty 4

## 2014-03-24 MED ORDER — EPHEDRINE SULFATE 50 MG/ML IJ SOLN
INTRAMUSCULAR | Status: DC | PRN
  Administered 2014-03-24 (×2): 10 mg via INTRAVENOUS

## 2014-03-24 MED ORDER — MIDAZOLAM HCL 2 MG/2ML IJ SOLN: 1.0000 mg | INTRAMUSCULAR | Status: DC | PRN

## 2014-03-24 MED ORDER — OXYCODONE HCL 5 MG PO TABS
ORAL_TABLET | ORAL | Status: AC
  Filled 2014-03-24: qty 1

## 2014-03-24 MED ORDER — FENTANYL CITRATE 0.05 MG/ML IJ SOLN
INTRAMUSCULAR | Status: DC | PRN
  Administered 2014-03-24: 100 ug via INTRAVENOUS

## 2014-03-24 MED ORDER — CHLORHEXIDINE GLUCONATE 4 % EX LIQD: 1.0000 "application " | Freq: Once | CUTANEOUS | Status: DC

## 2014-03-24 MED ORDER — ONDANSETRON HCL 4 MG/2ML IJ SOLN: 4.0000 mg | Freq: Once | INTRAMUSCULAR | Status: DC | PRN

## 2014-03-24 MED ORDER — LIDOCAINE HCL (CARDIAC) 20 MG/ML IV SOLN
INTRAVENOUS | Status: DC | PRN
  Administered 2014-03-24: 100 mg via INTRAVENOUS

## 2014-03-24 MED ORDER — MEPERIDINE HCL 25 MG/ML IJ SOLN: 6.2500 mg | INTRAMUSCULAR | Status: DC | PRN

## 2014-03-24 MED ORDER — OXYCODONE HCL 5 MG/5ML PO SOLN: 5.0000 mg | Freq: Once | ORAL | Status: AC | PRN

## 2014-03-24 MED ORDER — ONDANSETRON HCL 4 MG/2ML IJ SOLN
INTRAMUSCULAR | Status: DC | PRN
  Administered 2014-03-24: 4 mg via INTRAVENOUS

## 2014-03-24 MED ORDER — BUPIVACAINE HCL (PF) 0.25 % IJ SOLN
INTRAMUSCULAR | Status: AC
  Filled 2014-03-24: qty 30

## 2014-03-24 SURGICAL SUPPLY — 54 items
ADH SKN CLS APL DERMABOND .7 (GAUZE/BANDAGES/DRESSINGS) ×1
APL SKNCLS STERI-STRIP NONHPOA (GAUZE/BANDAGES/DRESSINGS)
BANDAGE ELASTIC 6 VELCRO ST LF (GAUZE/BANDAGES/DRESSINGS) IMPLANT
BENZOIN TINCTURE PRP APPL 2/3 (GAUZE/BANDAGES/DRESSINGS) IMPLANT
BINDER BREAST LRG (GAUZE/BANDAGES/DRESSINGS) ×2 IMPLANT
BINDER BREAST MEDIUM (GAUZE/BANDAGES/DRESSINGS) IMPLANT
BINDER BREAST XLRG (GAUZE/BANDAGES/DRESSINGS) IMPLANT
BINDER BREAST XXLRG (GAUZE/BANDAGES/DRESSINGS) IMPLANT
BLADE SURG 10 STRL SS (BLADE) ×3 IMPLANT
BLADE SURG 15 STRL LF DISP TIS (BLADE) IMPLANT
BLADE SURG 15 STRL SS (BLADE)
CANISTER SUCT 1200ML W/VALVE (MISCELLANEOUS) ×3 IMPLANT
CHLORAPREP W/TINT 26ML (MISCELLANEOUS) ×3 IMPLANT
CLIP TI WIDE RED SMALL 6 (CLIP) IMPLANT
CLOSURE WOUND 1/4X4 (GAUZE/BANDAGES/DRESSINGS)
COVER MAYO STAND STRL (DRAPES) ×3 IMPLANT
COVER TABLE BACK 60X90 (DRAPES) ×3 IMPLANT
DECANTER SPIKE VIAL GLASS SM (MISCELLANEOUS) IMPLANT
DERMABOND ADVANCED (GAUZE/BANDAGES/DRESSINGS) ×2
DERMABOND ADVANCED .7 DNX12 (GAUZE/BANDAGES/DRESSINGS) IMPLANT
DEVICE DUBIN W/COMP PLATE 8390 (MISCELLANEOUS) ×3 IMPLANT
DRAPE PED LAPAROTOMY (DRAPES) ×3 IMPLANT
DRAPE UTILITY XL STRL (DRAPES) ×3 IMPLANT
ELECT COATED BLADE 2.86 ST (ELECTRODE) ×3 IMPLANT
ELECT REM PT RETURN 9FT ADLT (ELECTROSURGICAL) ×3
ELECTRODE REM PT RTRN 9FT ADLT (ELECTROSURGICAL) ×1 IMPLANT
GAUZE SPONGE 4X4 12PLY STRL (GAUZE/BANDAGES/DRESSINGS) ×2 IMPLANT
GAUZE SPONGE 4X4 16PLY XRAY LF (GAUZE/BANDAGES/DRESSINGS) IMPLANT
GLOVE BIOGEL PI IND STRL 7.0 (GLOVE) IMPLANT
GLOVE BIOGEL PI INDICATOR 7.0 (GLOVE) ×2
GLOVE ECLIPSE 6.5 STRL STRAW (GLOVE) ×2 IMPLANT
GLOVE SURG SIGNA 7.5 PF LTX (GLOVE) ×5 IMPLANT
GOWN STRL REUS W/ TWL LRG LVL3 (GOWN DISPOSABLE) ×1 IMPLANT
GOWN STRL REUS W/ TWL XL LVL3 (GOWN DISPOSABLE) ×1 IMPLANT
GOWN STRL REUS W/TWL LRG LVL3 (GOWN DISPOSABLE) ×3
GOWN STRL REUS W/TWL XL LVL3 (GOWN DISPOSABLE) ×3
KIT MARKER MARGIN INK (KITS) ×2 IMPLANT
NDL HYPO 25X1 1.5 SAFETY (NEEDLE) ×1 IMPLANT
NEEDLE HYPO 25X1 1.5 SAFETY (NEEDLE) ×3 IMPLANT
NS IRRIG 1000ML POUR BTL (IV SOLUTION) ×3 IMPLANT
PACK BASIN DAY SURGERY FS (CUSTOM PROCEDURE TRAY) ×3 IMPLANT
PENCIL BUTTON HOLSTER BLD 10FT (ELECTRODE) ×3 IMPLANT
SHEET MEDIUM DRAPE 40X70 STRL (DRAPES) IMPLANT
SLEEVE SCD COMPRESS KNEE MED (MISCELLANEOUS) ×3 IMPLANT
SPONGE GAUZE 4X4 12PLY STER LF (GAUZE/BANDAGES/DRESSINGS) ×4 IMPLANT
SPONGE LAP 18X18 X RAY DECT (DISPOSABLE) ×3 IMPLANT
STRIP CLOSURE SKIN 1/4X4 (GAUZE/BANDAGES/DRESSINGS) IMPLANT
SUT MON AB 5-0 PS2 18 (SUTURE) ×3 IMPLANT
SUT VICRYL 3-0 CR8 SH (SUTURE) ×3 IMPLANT
SYR CONTROL 10ML LL (SYRINGE) ×3 IMPLANT
TOWEL OR NON WOVEN STRL DISP B (DISPOSABLE) ×1 IMPLANT
TUBE CONNECTING 20'X1/4 (TUBING) ×1
TUBE CONNECTING 20X1/4 (TUBING) ×2 IMPLANT
YANKAUER SUCT BULB TIP NO VENT (SUCTIONS) ×3 IMPLANT

## 2014-03-24 NOTE — Anesthesia Preprocedure Evaluation (Signed)

## 2014-03-24 NOTE — Op Note (Signed)
03/24/2014  11:04 AM  PATIENT:  Sara Williams DOB: [birth year] MRN: [id number]  PREOP DIAGNOSIS:  microcalcifications of the left breast  POSTOP DIAGNOSIS:   microcalcifications of the left breast, 2 o'clock position  (final path pending)  PROCEDURE:   Procedure(s): LEFT BREAST BIOPSY WITH NEEDLE LOCALIZATION  SURGEON:   Alphonsa Overall, M.D.  ANESTHESIA:   general  Anesthesiologist: Lillia Abed, MD CRNA: April W Carter, CRNA  General  EBL:  minimal  ml  DRAINS: none   LOCAL MEDICATIONS USED:   30 cc 1/4% Marcaine  SPECIMEN:   Breast lumpectomy and inferior margin  COUNTS CORRECT:  YES  INDICATIONS FOR PROCEDURE:  Sara Williams is a 63 y.o. (DOB: [birth year]) [name]  female whose primary care physician is NNODI, ADAKU, MD and comes for left breast lumpectomy for microcalcifications.  These have not been biopsied, so there is no pre op tissue diagnosis.  They are suspicious for changes in their appearence.    The indications and potential complications of surgery were explained to the patient. Potential complications include, but are not limited to, bleeding, infection, the need for further surgery, and nerve injury.     The patient had a needle loc wire placed at the 12 o'clock position of the left breast at [facility] by Dr. Marcelo Baldy.  OPERATIVE NOTE:   The patient was taken to room # 1 at [facility] where she underwent a general anesthesia  supervised by Anesthesiologist: Lillia Abed, MD CRNA: April W Carter, CRNA. Her left breast and axilla were prepped with  ChloraPrep and sterilely draped.    A time-out and the surgical check list was reviewed.    The wire came out at the 12 o'clock position of the left breast and went to the lateral aspect of the left nipple. I cut down around the wire and tried to take an ellipse of breast tissue around the wire by at least 1 cm.   I excised this block of breast tissue approximately 3 cm by 3 cm  in diameter. I did take the dissection  down to the pectoralis major. I painted the lumpectomy specimen with the 6 color paint kit and did a specimen mammogram which confirmed microcalcifications and the wire were all in the right position.  Dr. Marcelo Baldy suggested that the microcalcs went to the inferior edge of the specimen - so I took and inferior margin.  The inferior margin was painted with a suture coming out laterally.   I then irrigated the wound with saline. I infiltrated approximately 30 mL of 1/4% marcaine.  I then closed all the wounds in layers using 3-0 Vicryl sutures for the deep layer. At the skin, I closed the incisions with a 5-0 Monocryl suture. The incisions were then painted with Dermabond.  She had gauze place over the wounds and placed in a breast binder.   The patient tolerated the procedure well, was transported to the recovery room in good condition. Sponge and needle count were correct at the end of the case.   Final pathology is pending.   Alphonsa Overall, MD, [facility] Surgery Pager: [phone] Office phone:  [phone]

## 2014-03-24 NOTE — H&P (Signed)
Re: Sara Williams  DOB: [birth year]  MRN: [id number]   ASSESSMENT AND PLAN:  1. Left breast microcalcifications   Microcalcification at 12 o'clock in dense breast. Unable to do core biopsy.   I discussed proceeding with a wire localization open left breast biopsy. I discussed this with Dr. Marcelo Baldy and her thought that a wire loc would probably be better than a seed loc.   I discussed the potential risks, including, but not limited to, bleeding, infection, nerve injury and the need for more surgery.  2. Right breast microcalcifications   They have been there several years and no reason to biopsy these at this time.  3. Recurrent cyst right axilla.   Chief Complaint   Patient presents with   .  eval elft breast    REFERRING PHYSICIAN: NNODI, ADAKU, MD   HISTORY OF PRESENT ILLNESS:  Sara Williams is a 63 y.o. (DOB: 08-07-1951) [place] female whose primary care physician is NNODI, ADAKU, MD and comes to me today for microcalcification of the left breast. She has not prior history of breast disease.  She came by herself.  She sees P. Raquel Sarna, FNP, from GYN standpoint.  The patient has no family history of breast cancer. She went through menopause around 2006. She is on estradiol patch and uses estrace cream. She uses the patch for hot flashes.  She can feel no mass.  She had mammograms at [facility] on 01/02/2014. Dr. Isaiah Blakes saw areas at 6 o'clock on the right breast and 1 o'clock on the left breast.  Follow up films by Dr. Marcelo Baldy, 01/14/2014, showed the right side to be unchanged, but the left sided microca++ appeared newer. There was not enough area to do a biopsy, so she was referred for open biopsy. I spoke to Dr. Marcelo Baldy who thought that wire loc would be the best loc method.  Ms. Salamone also has some financial concerns.   Past Medical History   Diagnosis  Date   .  Anemia  01/1994   .  Breast cyst  9/98     aspiration   .  Irregular menses    .  Pelvic kidney      right sided   .  TMJ  (dislocation of temporomandibular joint)  1989     left   .  Osteopenia  12/09    Past Surgical History   Procedure  Laterality  Date   .  Colonoscopy     .  Temporomandibular joint arthroplasty   1989    Current Outpatient Prescriptions   Medication  Sig  Dispense  Refill   .  estradiol (ESTRACE) 0.1 MG/GM vaginal cream  1 gram vaginally on Monday and Friday and 1/2 gm on Wednesday  42.5 g  4   .  estradiol-norethindrone (COMBIPATCH) 0.05-0.14 MG/DAY  Place 1 patch onto the skin 2 (two) times a week.  8 patch  12   .  vitamin B-12 (CYANOCOBALAMIN) 1000 MCG tablet  Take 1,000 mcg by mouth daily.      No current facility-administered medications for this visit.    Allergies   Allergen  Reactions   .  Anaprox [Naproxen Sodium]    .  Aspirin    .  Dolobid [Diflunisal]     REVIEW OF SYSTEMS:  Skin: No history of rash. No history of abnormal moles.  Infection: No history of hepatitis or HIV. No history of MRSA.  Neurologic: No history of stroke. No history of seizure. No history of headaches.  Cardiac: No history of hypertension. No history of heart disease. No history of prior cardiac catheterization. No history of seeing a cardiologist.  Pulmonary: Does not smoke cigarettes. No asthma or bronchitis. No OSA/CPAP.  Endocrine: No diabetes. No thyroid disease.  Gastrointestinal: No history of stomach disease. No history of liver disease. No history of gall bladder disease. No history of pancreas disease. Colonoscopy by Dr. Watt Climes in 2012.  Urologic: Pelvic kidney.  Musculoskeletal: No history of joint or back disease.  Hematologic: No bleeding disorder. No history of anemia. Not anticoagulated.  Psycho-social: The patient is oriented. The patient has no obvious psychologic or social impairment to understanding our conversation and plan.   SOCIAL and FAMILY HISTORY:  Married.  Retired Control and instrumentation engineer.  Has 3 children and 9 grandchildren.   PHYSICAL EXAM:  BP 114/64  Pulse 64   Temp(Src) 98.6 F (37 C) (Oral)  Resp 16  Ht 5\' 4"  (1.626 m)  Wt 138 lb (62.596 kg)  BMI 23.68 kg/m2  SpO2 100%  LMP 08/20/2005 LMP 08/20/2005   General: WN older WF who is alert and generally healthy appearing.  HEENT: Normal. Pupils equal.  Neck: Supple. No mass. No thyroid mass.  Lymph Nodes: No supraclavicular or cervical or axillary nodes. She does have a 1.5 cm cyst of her right axilla (this was excised about 30 years ago)  Lungs: Clear to auscultation and symmetric breath sounds.  Heart: RRR. No murmur or rub.  Breasts: Right: Dense, small, no mass  Left: Dense, small, no obvious mass.  Abdomen: Soft. No mass. No tenderness. No hernia. Normal bowel sounds. No abdominal scars.  Rectal: Not done.  Extremities: Good strength and ROM in upper and lower extremities.  Neurologic: Grossly intact to motor and sensory function.  Psychiatric: Has normal mood and affect. Behavior is normal.   DATA REVIEWED:  Notes from [facility], MD, [facility] Surgery, [address] [place], [place]  Phone: [place]: [phone]

## 2014-03-24 NOTE — Anesthesia Postprocedure Evaluation (Signed)
Anesthesia Post Note  Patient: Sara Williams  Procedure(s) Performed: Procedure(s) (LRB): LEFT BREAST BIOPSY WITH NEEDLE LOCALIZATION (Left)  Anesthesia type: general  Patient location: PACU  Post pain: Pain level controlled  Post assessment: Patient's Cardiovascular Status Stable  Last Vitals:  Filed Vitals:   03/24/14 1232  BP: 113/60  Pulse: 81  Temp: 36.4 C  Resp: 20    Post vital signs: Reviewed and stable  Level of consciousness: sedated  Complications: No apparent anesthesia complications

## 2014-03-24 NOTE — Transfer of Care (Signed)
Immediate Anesthesia Transfer of Care Note  Patient: Sara Williams  Procedure(s) Performed: Procedure(s): LEFT BREAST BIOPSY WITH NEEDLE LOCALIZATION (Left)  Patient Location: PACU  Anesthesia Type:General  Level of Consciousness: awake and alert   Airway & Oxygen Therapy: Patient Spontanous Breathing and Patient connected to face mask oxygen  Post-op Assessment: Report given to PACU RN and Post -op Vital signs reviewed and stable  Post vital signs: Reviewed and stable  Complications: No apparent anesthesia complications

## 2014-03-24 NOTE — Anesthesia Procedure Notes (Signed)
Procedure Name: LMA Insertion Date/Time: 03/24/2014 10:01 AM Performed by: Lieutenant Diego Pre-anesthesia Checklist: Patient identified, Emergency Drugs available, Suction available and Patient being monitored Patient Re-evaluated:Patient Re-evaluated prior to inductionOxygen Delivery Method: Circle System Utilized Preoxygenation: Pre-oxygenation with 100% oxygen Intubation Type: IV induction Ventilation: Mask ventilation without difficulty LMA: LMA inserted LMA Size: 4.0 Number of attempts: 1 Airway Equipment and Method: bite block Placement Confirmation: positive ETCO2 and breath sounds checked- equal and bilateral Tube secured with: Tape Dental Injury: Teeth and Oropharynx as per pre-operative assessment

## 2014-03-24 NOTE — Discharge Instructions (Signed)
[facility] - DISCHARGE INSTRUCTIONS TO PATIENT  Activity:  Driving - May drive tomorrow, if doing well.   Lifting - Take it easy for 3 days, then no limit  Wound Care:   Leave incision dry for 2 days, then remove bandage and shower  Diet:  As tolerated.  Follow up appointment:  Call Dr. Pollie Friar office [facility]) at [phone] for an appointment in 2 - 3 weeks.  Medications and dosages:  Resume your home medications.  You have a prescription for:  Vicodin  Call Dr. Lucia Gaskins or his office  [phone]) if you have:  Temperature greater than 100.4,  Persistent nausea and vomiting,  Severe uncontrolled pain,  Redness, tenderness, or signs of infection (pain, swelling, redness, odor or green/yellow discharge around the site),  Difficulty breathing, headache or visual disturbances,  Any other questions or concerns you may have after discharge.  In an emergency, call 911 or go to an Emergency Department at a nearby hospital.   Post Anesthesia Home Care Instructions  Activity: Get plenty of rest for the remainder of the day. A responsible adult should stay with you for 24 hours following the procedure.  For the next 24 hours, DO NOT: -Drive a car -Paediatric nurse -Drink alcoholic beverages -Take any medication unless instructed by your physician -Make any legal decisions or sign important papers.  Meals: Start with liquid foods such as gelatin or soup. Progress to regular foods as tolerated. Avoid greasy, spicy, heavy foods. If nausea and/or vomiting occur, drink only clear liquids until the nausea and/or vomiting subsides. Call your physician if vomiting continues.  Special Instructions/Symptoms: Your throat may feel dry or sore from the anesthesia or the breathing tube placed in your throat during surgery. If this causes discomfort, gargle with warm salt water. The discomfort should disappear within 24 hours.

## 2014-03-25 ENCOUNTER — Encounter (HOSPITAL_BASED_OUTPATIENT_CLINIC_OR_DEPARTMENT_OTHER): Payer: Self-pay | Admitting: Surgery

## 2014-03-28 ENCOUNTER — Telehealth: Payer: Self-pay | Admitting: Nurse Practitioner

## 2014-03-28 NOTE — Telephone Encounter (Signed)
Pt wants to talk with the nurse no info given

## 2014-03-28 NOTE — Telephone Encounter (Signed)
Spoke with patient. Patient states that she had a breast biopsy done that came back "normal." Was previously on the combi patch and estradiol vaginal cream. Patient has now been off of these medications for two months. "I am back to the night sweats. Dr.Miller mentioned decreasing the dose of my combi patch. I didn't know if this was still something we could do. I was on the cream for a vaginal discharge I was having. Now that I have been off of both of the medications I haven't had any discharge. So I don't know where to go from here." Advised would send a message over to [place] and give patient a call back with further recommendations and instructions. Patient agreeable.

## 2014-03-28 NOTE — Telephone Encounter (Signed)
With her recent breast issues, would be good is she could stay off the HRT.  OK to try Estroven OTC or black cohosh to see if this will help with hot flashes.  Hot flashes will get better in a few months.  If she can just get over this hump, she will not need to be on it and I think that would be best.  May need OV.  PG or me ok.

## 2014-03-31 ENCOUNTER — Telehealth (INDEPENDENT_AMBULATORY_CARE_PROVIDER_SITE_OTHER): Payer: Self-pay | Admitting: *Deleted

## 2014-03-31 NOTE — Telephone Encounter (Signed)
Per Dr. Lucia Gaskins, appt scheduled for p/o 04-18-14 @ 8:30.  Called pt to make her aware and LMOM if this appt was not convenient for her, please call the office back.  Anderson Malta

## 2014-03-31 NOTE — Telephone Encounter (Signed)
Spoke with patient. Advised of message as seen below from [place]. Patient is agreeable and verbalizes understanding. Patient will try these OTC options.  Routing to provider for final review. Patient agreeable to disposition. Will close encounter

## 2014-03-31 NOTE — Telephone Encounter (Signed)
Pt called and spoke to [employer] copy.  Pt needs a p/o appt 2-3 weeks.  Her surgery date was 03-24-14.  Left br biopsy with needle localization.  Please advise!  Anderson Malta

## 2014-04-01 NOTE — Telephone Encounter (Signed)
error 

## 2014-04-04 ENCOUNTER — Encounter (INDEPENDENT_AMBULATORY_CARE_PROVIDER_SITE_OTHER): Payer: Self-pay

## 2014-04-17 ENCOUNTER — Encounter (INDEPENDENT_AMBULATORY_CARE_PROVIDER_SITE_OTHER): Payer: BC Managed Care – PPO | Admitting: Surgery

## 2014-04-18 ENCOUNTER — Encounter (INDEPENDENT_AMBULATORY_CARE_PROVIDER_SITE_OTHER): Payer: BC Managed Care – PPO | Admitting: Surgery

## 2014-06-09 ENCOUNTER — Encounter (HOSPITAL_BASED_OUTPATIENT_CLINIC_OR_DEPARTMENT_OTHER): Payer: Self-pay | Admitting: Surgery

## 2014-12-25 ENCOUNTER — Encounter: Payer: Self-pay | Admitting: Nurse Practitioner

## 2014-12-25 ENCOUNTER — Ambulatory Visit (INDEPENDENT_AMBULATORY_CARE_PROVIDER_SITE_OTHER): Payer: BC Managed Care – PPO | Admitting: Nurse Practitioner

## 2014-12-25 VITALS — BP 110/66 | HR 60 | Ht 64.25 in | Wt 130.0 lb

## 2014-12-25 DIAGNOSIS — Z0000 Encounter for general adult medical examination without abnormal findings: Secondary | ICD-10-CM | POA: Diagnosis not present

## 2014-12-25 DIAGNOSIS — Z01419 Encounter for gynecological examination (general) (routine) without abnormal findings: Secondary | ICD-10-CM | POA: Diagnosis not present

## 2014-12-25 DIAGNOSIS — Z1211 Encounter for screening for malignant neoplasm of colon: Secondary | ICD-10-CM | POA: Diagnosis not present

## 2014-12-25 LAB — POCT URINALYSIS DIPSTICK
Bilirubin, UA: NEGATIVE
Blood, UA: NEGATIVE
Glucose, UA: NEGATIVE
Ketones, UA: NEGATIVE
Leukocytes, UA: NEGATIVE
Nitrite, UA: NEGATIVE
Protein, UA: NEGATIVE
UROBILINOGEN UA: NEGATIVE
pH, UA: 6.5

## 2014-12-25 NOTE — Progress Notes (Addendum)
Patient ID: Sara Williams, female   DOB: [birth year], 64 y.o.   MRN: [id number] 64 y.o. G3P3 Married  Caucasian Fe here for annual exam.  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 Since last July,  off HRT and vaginal estrogen.  Now no more sticky vaginal discharge and weight loss of 10 lbs.   Some night sweats but tolerable.some vaginal dryness.  Patient's last menstrual period was 08/20/2005.          Sexually active: Yes.    The current method of family planning is post menopausal status.    Exercising: Yes.    Walking  Smoker:  no  Health Maintenance: Pap:  10/12/11, negative with neg HR HPV MMG:  01/02/14, with diagnostic and stereotactic biopsy, calcifications, follow up needed June 2016 Colonoscopy:  02/2010, recheck 5-10 years BMD:   10/2010, T Score -1.8  S/-2.1 L TDaP:  09/15/10 Shingles:not yet Prevnar: not yet Labs:  PCP  Urine:  Negative ph: 6.5   reports that she has never smoked. She has never used smokeless tobacco. She reports that she does not drink alcohol or use illicit drugs.  Past Medical History  Diagnosis Date  . Anemia 01/1994  . Breast cyst 9/98    aspiration  . Irregular menses   . Pelvic kidney     right sided  . 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 Osteopenia 12/09  . Wears glasses     Past Surgical History  Procedure Laterality Date  . Colonoscopy    . Temporomandibular joint arthroplasty  1989  . Breast biopsy Left 03/24/2014    Procedure: LEFT BREAST BIOPSY WITH NEEDLE LOCALIZATION;  Surgeon: Shann Medal, MD;  Location: [facility];  Service: General;  Laterality: Left;    Current Outpatient Prescriptions  Medication Sig Dispense Refill  . BLACK COHOSH EXTRACT PO Take 40 mg by mouth daily.    . Cholecalciferol 4000 UNITS CAPS Take 2 capsules by mouth daily.    . Cyanocobalamin 3000 MCG CAPS Take 1 capsule by mouth daily.    Marland Kitchen OVER THE COUNTER MEDICATION Juice Plus Vitamin     No current  facility-administered medications for this visit.    Family History  Problem Relation Age of Onset  . Heart failure Maternal Grandfather     ROS:  Pertinent items are noted in HPI.  Otherwise, a comprehensive ROS was negative.  Exam:   BP 110/66 mmHg  Pulse 60  Ht 5' 4.25" (1.632 m)  Wt 130 lb (58.968 kg)  BMI 22.14 kg/m2  LMP 08/20/2005 Height: 5' 4.25" (163.2 cm) Ht Readings from Last 3 Encounters:  12/25/14 5' 4.25" (1.632 m)  03/24/14 5\' 4"  (1.626 m)  02/12/14 5\' 4"  (1.626 m)    General appearance: alert, cooperative and appears stated age Head: Normocephalic, without obvious abnormality, atraumatic Neck: no adenopathy, supple, symmetrical, trachea midline and thyroid normal to inspection and palpation Lungs: clear to auscultation bilaterally Breasts: normal appearance, no masses or tenderness, surgical scar on left Heart: regular rate and rhythm Abdomen: soft, non-tender; no masses,  no organomegaly Extremities: extremities normal, atraumatic, no cyanosis or edema Skin: Skin color, texture, turgor normal. No rashes or lesions Lymph nodes: Cervical, supraclavicular, and axillary nodes normal. No abnormal inguinal nodes palpated Neurologic: Grossly normal   Pelvic: External genitalia:  no lesions              Urethra:  normal appearing urethra with no masses, tenderness or  lesions              Bartholin's and Skene's: normal                 Vagina: normal appearing vagina with normal color and discharge, no lesions              Cervix: anteverted              Pap taken: Yes.   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

## 2014-12-25 NOTE — Patient Instructions (Addendum)

## 2014-12-26 ENCOUNTER — Telehealth: Payer: Self-pay | Admitting: Nurse Practitioner

## 2014-12-26 NOTE — Telephone Encounter (Signed)
Patient has a MMG appointment @ [facility] 01/07/2015. Patient needs the order to be changed from screening to diagnostic. Last seen 12/25/14.

## 2014-12-26 NOTE — Telephone Encounter (Signed)
Patient notified order sent and should be ready for appointment on 01/07/15. Advised to return call prn. Patient agreeable. Routing to provider for final review. Patient agreeable to disposition. Will close encounter.

## 2014-12-28 NOTE — Progress Notes (Signed)
Encounter reviewed by Dr. Brook Silva.  

## 2014-12-30 NOTE — Addendum Note (Signed)
Addended by: Kem Boroughs R on: 12/30/2014 08:30 AM   Modules accepted: Orders

## 2015-01-01 LAB — IPS PAP TEST WITH HPV

## 2015-01-06 ENCOUNTER — Other Ambulatory Visit (INDEPENDENT_AMBULATORY_CARE_PROVIDER_SITE_OTHER): Payer: BC Managed Care – PPO

## 2015-01-06 DIAGNOSIS — Z0000 Encounter for general adult medical examination without abnormal findings: Secondary | ICD-10-CM

## 2015-01-06 LAB — CBC
HCT: 39.3 % (ref 36.0–46.0)
Hemoglobin: 13.2 g/dL (ref 12.0–15.0)
MCH: 30.5 pg (ref 26.0–34.0)
MCHC: 33.6 g/dL (ref 30.0–36.0)
MCV: 90.8 fL (ref 78.0–100.0)
MPV: 10.1 fL (ref 8.6–12.4)
PLATELETS: 223 10*3/uL (ref 150–400)
RBC: 4.33 MIL/uL (ref 3.87–5.11)
RDW: 14.1 % (ref 11.5–15.5)
WBC: 5.6 10*3/uL (ref 4.0–10.5)

## 2015-01-06 LAB — LIPID PANEL
CHOL/HDL RATIO: 3.4 ratio
Cholesterol: 179 mg/dL (ref 0–200)
HDL: 53 mg/dL (ref >=46)
LDL CALC: 108 mg/dL — AB (ref 0–99)
TRIGLYCERIDES: 91 mg/dL (ref <150)
VLDL: 18 mg/dL (ref 0–40)

## 2015-01-06 LAB — COMPREHENSIVE METABOLIC PANEL
ALT: 18 U/L (ref 0–35)
AST: 20 U/L (ref 0–37)
Albumin: 4 g/dL (ref 3.5–5.2)
Alkaline Phosphatase: 63 U/L (ref 39–117)
BUN: 13 mg/dL (ref 6–23)
CALCIUM: 9.2 mg/dL (ref 8.4–10.5)
CO2: 28 mEq/L (ref 19–32)
CREATININE: 0.79 mg/dL (ref 0.50–1.10)
Chloride: 104 mEq/L (ref 96–112)
Glucose, Bld: 85 mg/dL (ref 70–99)
Potassium: 4.4 mEq/L (ref 3.5–5.3)
Sodium: 139 mEq/L (ref 135–145)
TOTAL PROTEIN: 6.3 g/dL (ref 6.0–8.3)
Total Bilirubin: 0.4 mg/dL (ref 0.2–1.2)

## 2015-01-06 LAB — TSH: TSH: 1.389 u[IU]/mL (ref 0.350–4.500)

## 2015-01-07 LAB — VITAMIN D 25 HYDROXY (VIT D DEFICIENCY, FRACTURES): Vit D, 25-Hydroxy: 79 ng/mL (ref 30–100)

## 2015-01-20 ENCOUNTER — Telehealth: Payer: Self-pay | Admitting: Nurse Practitioner

## 2015-01-20 LAB — FECAL OCCULT BLOOD, IMMUNOCHEMICAL: IFOBT: NEGATIVE

## 2015-01-20 NOTE — Telephone Encounter (Signed)
Please let pt. Know that BMD done 01/07/15 shows a T Score at the spine at -1.8, left hip -2.4, right hip -2.0, left radius -3.2.  The lowest reference point is the radius which falls into the osteoporosis range.  The others fall into the Osteopenia range.  Comparison to last study 2012 there is as loss of the left hip by 4.6 % and other sites without a signigicant change.  There are no comparisons for the forearm.  The FRAX score for major fracture in 10 years is 11.7 % (goal is < 20%).  The FRAX score for hip fracture in 10 years is 2.4% (goal is <3%).  While some bone loss is normal and expectant - we do not want to see further loss.  She must do exercise with weights and continue with exercise and Vit D.  Repeat in 2 years.

## 2015-01-20 NOTE — Addendum Note (Signed)
Addended by: Lowella Fairy on: 01/20/2015 02:14 PM   Modules accepted: Orders

## 2015-01-22 NOTE — Telephone Encounter (Signed)
Pt notified of results.  States she was recently advised to stop Vit D.  Reviewed recent labs with patient and advised to stop OTC Vit D for the summer and restart in late fall or winter.  Pt voices understanding and is agreeable with plan.

## 2015-01-22 NOTE — Telephone Encounter (Signed)
I have attempted to contact this patient by phone with the following results: left message to return call to [address] at [phone] on answering machine (mobile per [facility]).  Pt name verified in voicemail.  Advised stool test negative, but requested call back for BMD results as they are more detailed.  [phone] (Mobile)

## 2015-12-14 ENCOUNTER — Telehealth: Payer: Self-pay | Admitting: Nurse Practitioner

## 2015-12-14 ENCOUNTER — Encounter: Payer: Self-pay | Admitting: Nurse Practitioner

## 2015-12-14 ENCOUNTER — Ambulatory Visit (INDEPENDENT_AMBULATORY_CARE_PROVIDER_SITE_OTHER): Payer: Medicare Other | Admitting: Nurse Practitioner

## 2015-12-14 VITALS — BP 110/66 | HR 80 | Temp 97.7°F | Ht 64.25 in | Wt 141.0 lb

## 2015-12-14 DIAGNOSIS — N3945 Continuous leakage: Secondary | ICD-10-CM | POA: Diagnosis not present

## 2015-12-14 LAB — POCT URINALYSIS DIPSTICK
Bilirubin, UA: NEGATIVE
Glucose, UA: NEGATIVE
KETONES UA: NEGATIVE
Nitrite, UA: NEGATIVE
PH UA: 5.5
PROTEIN UA: NEGATIVE
Urobilinogen, UA: NEGATIVE

## 2015-12-14 MED ORDER — NITROFURANTOIN MONOHYD MACRO 100 MG PO CAPS: 100.0000 mg | ORAL_CAPSULE | Freq: Two times a day (BID) | ORAL | Status: DC

## 2015-12-14 NOTE — Telephone Encounter (Signed)
Patient said "I have developed a little problem and not sure if this something Edman Circle, FNP can help me with or do I need to see my PCP". Patient is asking to talk with a nurse before scheduling.

## 2015-12-14 NOTE — Progress Notes (Signed)
Patient ID: Sara Williams, female   DOB: Jun 16, 1951, 65 y.o.   MRN: [id number]  65 y.o.Married Caucasian female G3P3 here with complaint of UTI, with sudden onset last week. Patient complaining of:  leaking of urine. Patient denies fever, chills, nausea or back pain. No new personal products.  Denies vaginal symptoms.    Contraception is post-menopause.  No change in partner. Some menopausal with vaginal dryness. Patient has adequate water intake   O: Healthy female WDWN Affect: Normal, orientation x 3 Skin : warm and dry CVAT: negative bilateral Abdomen: negative for suprapubic tenderness  POCT:  2+ leuk's, trace RBC, ph 5.5  A:  R/O UTI   P: Reviewed findings of UTI and need for treatment. Rx:  Macrobid 100 mg BID [id number] micro, culture Reviewed warning signs and symptoms of UTI and need to advise if occurring. Encouraged to limit soda, tea, and coffee   RV prn

## 2015-12-14 NOTE — Telephone Encounter (Signed)
Return call to patient. States she has developed urinary leakage and is unsure who to see about this.  Sudden onset one week ago. Constant leaking that requires her to wear pads. Denies any pain or burning like UTI. Advised we would be happy to see her, need to start by checking for UTI so offered appointment with Patty for assessment and patty will direct her from there. Patient agreeable and appointment schedule for 4pm today.  Last annual 12-25-14 with Patty and is scheduled for 12-29-15 with Patty.  Routing to provider for final review. Patient agreeable to disposition. Will close encounter.

## 2015-12-14 NOTE — Patient Instructions (Signed)

## 2015-12-15 LAB — URINALYSIS, MICROSCOPIC ONLY
CRYSTALS: NONE SEEN [HPF]
Casts: NONE SEEN [LPF]
YEAST: NONE SEEN [HPF]

## 2015-12-16 LAB — URINE CULTURE

## 2015-12-17 ENCOUNTER — Telehealth: Payer: Self-pay | Admitting: Emergency Medicine

## 2015-12-17 NOTE — Telephone Encounter (Signed)
-----   Message from Kem Boroughs, [place] sent at 12/16/2015  8:29 AM EDT ----- Please let pt know that urine did show bacteria but no one specific organism.  See how she is feeling and have her to finish med's if feeling better.  The urine micro did show many bacteria.

## 2015-12-17 NOTE — Telephone Encounter (Signed)
Call to patient. She is given message from Kem Boroughs, [place]. She states that she is not having any pain but does continue to have some urine leakage. No fevers or chills. Feels "slightly" better. She is advised to complete Macrobid per Kem Boroughs, FNP and call back if urine leakage continues once completed. She is agreeable and will call back if not improved.  Routing to Kem Boroughs, FNP. Okay to close?

## 2015-12-18 NOTE — Telephone Encounter (Signed)
Ok to close chart.

## 2015-12-20 NOTE — Progress Notes (Signed)
Encounter reviewed by Dr. Jernard Reiber Amundson C. Silva.  

## 2015-12-29 ENCOUNTER — Ambulatory Visit (INDEPENDENT_AMBULATORY_CARE_PROVIDER_SITE_OTHER): Payer: Medicare Other | Admitting: Nurse Practitioner

## 2015-12-29 ENCOUNTER — Encounter: Payer: Self-pay | Admitting: Nurse Practitioner

## 2015-12-29 VITALS — BP 108/66 | HR 80 | Ht 64.5 in | Wt 136.0 lb

## 2015-12-29 DIAGNOSIS — Z1211 Encounter for screening for malignant neoplasm of colon: Secondary | ICD-10-CM

## 2015-12-29 DIAGNOSIS — Z206 Contact with and (suspected) exposure to human immunodeficiency virus [HIV]: Secondary | ICD-10-CM | POA: Diagnosis not present

## 2015-12-29 DIAGNOSIS — N760 Acute vaginitis: Secondary | ICD-10-CM

## 2015-12-29 DIAGNOSIS — Z0000 Encounter for general adult medical examination without abnormal findings: Secondary | ICD-10-CM | POA: Diagnosis not present

## 2015-12-29 DIAGNOSIS — Z205 Contact with and (suspected) exposure to viral hepatitis: Secondary | ICD-10-CM

## 2015-12-29 DIAGNOSIS — M8580 Other specified disorders of bone density and structure, unspecified site: Secondary | ICD-10-CM

## 2015-12-29 DIAGNOSIS — Z01419 Encounter for gynecological examination (general) (routine) without abnormal findings: Secondary | ICD-10-CM | POA: Diagnosis not present

## 2015-12-29 MED ORDER — FLUCONAZOLE 150 MG PO TABS: 150.0000 mg | ORAL_TABLET | Freq: Once | ORAL | Status: DC

## 2015-12-29 NOTE — Progress Notes (Signed)
Patient ID: Sara Williams, female   DOB: 10-16-1950, 65 y.o.   MRN: [id number]  65 y.o. G23P0003 Married  Caucasian Fe here for annual exam. No new health diagnosis since last year.  She is not wanting treatment as of yet for Osteopenia.  Recent problems with urinary leakage earlier this month.  The urine culture was contaminated but she completed the antibiotics and no further urinary symptoms.  She is having slight vulvar irritation past 2 days.   She is driving to care for 84 month old grand daughter for the next 4 days.  Patient's last menstrual period was 08/20/2005 (exact date).          Sexually active: Yes.    The current method of family planning is post menopausal status.    Exercising: Yes.    elliptical for 30 minutes or 2+ miles 3 times per week. Smoker:  no  Health Maintenance: Pap:12/25/14, negative with neg HR HPV MMG:01/07/15, Bi-Rads 1: Negative; [facility] Colonoscopy: 03/03/2010, recheck 10 years, IFOB given today BMD:01/07/15 T Score, -1.8 Spine / -2.0 Right Femur Neck / -2.4 Left Femur Neck / -3.2 Left Radius TDaP: 09/15/10 Shingles: Never will check with pharmacy about coverage Pneumonia: Never  Hep C and HIV: done today Labs: Affirm, IFOB    reports that she has never smoked. She has never used smokeless tobacco. She reports that she does not drink alcohol or use illicit drugs.  Past Medical History  Diagnosis Date  . Anemia 01/1994  . Breast cyst 9/98    aspiration  . Irregular menses   . Pelvic kidney     right sided  . 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 Osteopenia 12/09  . Wears glasses     Past Surgical History  Procedure Laterality Date  . Colonoscopy    . Temporomandibular joint arthroplasty  1989  . Breast biopsy Left 03/24/2014    Procedure: LEFT BREAST BIOPSY WITH NEEDLE LOCALIZATION;  Surgeon: Shann Medal, MD;  Location: [place];  Service: General;  Laterality: Left;    Current Outpatient Prescriptions   Medication Sig Dispense Refill  . BLACK COHOSH EXTRACT PO Take 40 mg by mouth daily. Reported on 12/14/2015    . Cholecalciferol 4000 UNITS CAPS Take 1 capsule by mouth daily. Vitamin D    . Cyanocobalamin 3000 MCG CAPS Take 1 capsule by mouth daily. Vitamin B12    . fluconazole (DIFLUCAN) 150 MG tablet Take 1 tablet (150 mg total) by mouth once. Take one tablet.  Repeat in 48 hours if symptoms are not completely resolved. 2 tablet 0  . OVER THE COUNTER MEDICATION Juice Plus Vitamin     No current facility-administered medications for this visit.    Family History  Problem Relation Age of Onset  . Heart failure Maternal Grandfather     ROS:  Pertinent items are noted in HPI.  Otherwise, a comprehensive ROS was negative.  Exam:   BP 108/66 mmHg  Pulse 80  Ht 5' 4.5" (1.638 m)  Wt 136 lb (61.689 kg)  BMI 22.99 kg/m2  LMP 08/20/2005 (Exact Date) Height: 5' 4.5" (163.8 cm) Ht Readings from Last 3 Encounters:  12/29/15 5' 4.5" (1.638 m)  12/14/15 5' 4.25" (1.632 m)  12/25/14 5' 4.25" (1.632 m)    General appearance: alert, cooperative and appears stated age Head: Normocephalic, without obvious abnormality, atraumatic Neck: no adenopathy, supple, symmetrical, trachea midline and thyroid normal to inspection and palpation Lungs: clear to  auscultation bilaterally Breasts: normal appearance, no masses or tenderness, scar left breast. Heart: regular rate and rhythm Abdomen: soft, non-tender; no masses,  no organomegaly Extremities: extremities normal, atraumatic, no cyanosis or edema Skin: Skin color, texture, turgor normal. No rashes or lesions Lymph nodes: Cervical, supraclavicular, and axillary nodes normal. No abnormal inguinal nodes palpated Neurologic: Grossly normal   Pelvic: External genitalia:  no lesions but there is erythema and linear cuts along the labia C/W external yeast.              Urethra:  normal appearing urethra with no masses, tenderness or lesions               Bartholin's and Skene's: normal                 Vagina: atrophic appearing vagina with normal color and only minimal discharge, no lesions              Cervix: anteverted              Pap taken: No. 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  Will repeat BMD 01/2017  Follow with labs  Follow with Affirm  Diflucan 150 mg X 2   counseled on breast self exam, mammography screening, osteoporosis, adequate intake of calcium and vitamin D, diet and exercise, Kegel's exercises return annually or prn  An After Visit Summary was printed and given to the patient.

## 2015-12-29 NOTE — Patient Instructions (Signed)

## 2015-12-30 LAB — HIV ANTIBODY (ROUTINE TESTING W REFLEX): HIV: NONREACTIVE

## 2015-12-30 LAB — WET PREP BY MOLECULAR PROBE
CANDIDA SPECIES: NEGATIVE
GARDNERELLA VAGINALIS: NEGATIVE
TRICHOMONAS VAG: NEGATIVE

## 2015-12-30 LAB — HEPATITIS C ANTIBODY: HCV Ab: NEGATIVE

## 2016-01-04 NOTE — Progress Notes (Signed)
Encounter reviewed by Dr. Brook Amundson C. Silva.  

## 2016-01-06 ENCOUNTER — Telehealth: Payer: Self-pay | Admitting: Nurse Practitioner

## 2016-01-06 NOTE — Telephone Encounter (Signed)
Spoke with patient. Patient states that when she was seen for evaluation with Kem Boroughs, FNP last week she was given an rx for Diflucan 150 mg with 2 tablets. Patient took the first tablet last week which helped to relieve her vaginal itching. States 3 days ago she began to have increased vaginal itching with thick Briel/gray color discharge. States that vaginal itching is only externally. Asking if she may take her second dose of Diflucan. Advised she may go ahead and take second Diflucan tablet at this time. Advised if symptoms persist after 48 hours from taking second dosage she will contact the office. She is agreeable.  Kem Boroughs, FNP do you agree with plan?

## 2016-01-06 NOTE — Telephone Encounter (Signed)
Patient would like to speak with the nurse regarding a reacurring problem she is having.

## 2016-01-06 NOTE — Telephone Encounter (Signed)
Agree with plan 

## 2016-02-02 ENCOUNTER — Encounter: Payer: Self-pay | Admitting: Nurse Practitioner

## 2016-02-08 LAB — FECAL OCCULT BLOOD, IMMUNOCHEMICAL: IMMUNOLOGICAL FECAL OCCULT BLOOD TEST: NEGATIVE

## 2016-02-08 NOTE — Addendum Note (Signed)
Addended by: Remigio Eisenmenger on: 02/08/2016 11:03 AM   Modules accepted: Orders

## 2016-12-30 ENCOUNTER — Encounter: Payer: Self-pay | Admitting: Nurse Practitioner

## 2016-12-30 NOTE — Progress Notes (Signed)
Patient ID: Sara Williams, female   DOB: Mar 02, 1951, 66 y.o.   MRN: [id number]  66 y.o. [id number] Married  Caucasian Fe here for annual exam.  No new health problems and feels well. Mother age 67 and doing well.  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 Husband is still working.  She is not wanting treatment as of yet for Osteopenia.  Patient's last menstrual period was 08/20/2005 (exact date).          Sexually active: Yes.    The current method of family planning is post menopausal status.    Exercising: Yes.    Home exercise routine includes walking 30 minutes (elliptical) 3 times per week.. Smoker:  no  Health Maintenance: Pap: 12/25/14, Negative with neg HR HPV  10/12/11, Negative with neg HR HPV History of Abnormal Pap: no MMG: 01/14/16, 3D-no, Density Category B, Bi-Rads 1:  Negative Self Breast exams: yes Colonoscopy: 02/2010, repeat in 5-10 years, IFOB given 2017 BMD: 01/07/15 T Score: -1.8 Spine / -2.0 Right Femur Neck / -2.4 Left Femur Neck / -2.4 Left 1/3 Radius TDaP: 09/15/10 Shingles: Never Pneumonia: Never Hep C: 12/29/15 Labs: done today   reports that she has never smoked. She has never used smokeless tobacco. She reports that she does not drink alcohol or use drugs.  Past Medical History:  Diagnosis Date  . Anemia 01/1994  . Breast cyst 9/98   aspiration  . Irregular menses   . Osteopenia 12/09  . Pelvic kidney    right sided  . TMJ (dislocation of temporomandibular joint) 1989   left  . Wears glasses     Past Surgical History:  Procedure Laterality Date  . BREAST BIOPSY Left 03/24/2014   Procedure: LEFT BREAST BIOPSY WITH NEEDLE LOCALIZATION;  Surgeon: Shann Medal, MD;  Location: [place];  Service: General;  Laterality: Left;  . COLONOSCOPY    . TEMPOROMANDIBULAR JOINT ARTHROPLASTY  1989    Current Outpatient Prescriptions  Medication Sig Dispense Refill  . BLACK COHOSH EXTRACT PO Take 40 mg by mouth daily. Reported on 12/14/2015    .  Cyanocobalamin 3000 MCG CAPS Take 1 capsule by mouth daily. Vitamin B12    . OVER THE COUNTER MEDICATION Juice Plus Vitamin     No current facility-administered medications for this visit.     Family History  Problem Relation Age of Onset  . Heart failure Maternal Grandfather     ROS:  Pertinent items are noted in HPI.  Otherwise, a comprehensive ROS was negative.  Exam:   Ht 5' 4.5" (1.638 m)   Wt 141 lb (64 kg)   LMP 08/20/2005 (Exact Date)   BMI 23.83 kg/m  Height: 5' 4.5" (163.8 cm) Ht Readings from Last 3 Encounters:  01/03/17 5' 4.5" (1.638 m)  12/29/15 5' 4.5" (1.638 m)  12/14/15 5' 4.25" (1.632 m)    General appearance: alert, cooperative and appears stated age Head: Normocephalic, without obvious abnormality, atraumatic Neck: no adenopathy, supple, symmetrical, trachea midline and thyroid normal to inspection and palpation Lungs: clear to auscultation bilaterally Breasts: normal appearance, no masses or tenderness Heart: regular rate and rhythm Abdomen: soft, non-tender; no masses,  no organomegaly Extremities: extremities normal, atraumatic, no cyanosis or edema Skin: Skin color, texture, turgor normal. No rashes or lesions Lymph nodes: Cervical, supraclavicular, and axillary nodes normal. No abnormal inguinal nodes palpated Neurologic: Grossly normal   Pelvic: External genitalia:  no lesions  Urethra:  normal appearing urethra with no masses, tenderness or lesions              Bartholin's and Skene's: normal                 Vagina: atrophic appearing vagina with pale color and discharge, no lesions              Cervix: anteverted              Pap taken: No. 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  Order is faxed to [facility].  Follow with labs - will be checking PTH/calcium/ TSH  Counseled on breast self exam, mammography screening, adequate intake of calcium and vitamin D, diet and exercise, Kegel's exercises return annually or prn  An After Visit Summary was printed and given to the patient.

## 2017-01-03 ENCOUNTER — Encounter: Payer: Self-pay | Admitting: Nurse Practitioner

## 2017-01-03 ENCOUNTER — Ambulatory Visit (INDEPENDENT_AMBULATORY_CARE_PROVIDER_SITE_OTHER): Payer: Medicare Other | Admitting: Nurse Practitioner

## 2017-01-03 VITALS — Ht 64.5 in | Wt 141.0 lb

## 2017-01-03 DIAGNOSIS — E559 Vitamin D deficiency, unspecified: Secondary | ICD-10-CM | POA: Diagnosis not present

## 2017-01-03 DIAGNOSIS — M85859 Other specified disorders of bone density and structure, unspecified thigh: Secondary | ICD-10-CM

## 2017-01-03 DIAGNOSIS — E7800 Pure hypercholesterolemia, unspecified: Secondary | ICD-10-CM | POA: Diagnosis not present

## 2017-01-03 DIAGNOSIS — Z01411 Encounter for gynecological examination (general) (routine) with abnormal findings: Secondary | ICD-10-CM

## 2017-01-03 LAB — COMPREHENSIVE METABOLIC PANEL
ALT: 9 U/L (ref 6–29)
AST: 15 U/L (ref 10–35)
Albumin: 4.3 g/dL (ref 3.6–5.1)
Alkaline Phosphatase: 66 U/L (ref 33–130)
BUN: 14 mg/dL (ref 7–25)
CALCIUM: 9.7 mg/dL (ref 8.6–10.4)
CHLORIDE: 103 mmol/L (ref 98–110)
CO2: 26 mmol/L (ref 20–31)
Creat: 0.95 mg/dL (ref 0.50–0.99)
GLUCOSE: 86 mg/dL (ref 65–99)
POTASSIUM: 4.7 mmol/L (ref 3.5–5.3)
Sodium: 141 mmol/L (ref 135–146)
Total Bilirubin: 0.3 mg/dL (ref 0.2–1.2)
Total Protein: 7 g/dL (ref 6.1–8.1)

## 2017-01-03 LAB — LIPID PANEL
CHOL/HDL RATIO: 2.9 ratio (ref <5.0)
CHOLESTEROL: 212 mg/dL — AB (ref <200)
HDL: 73 mg/dL (ref >50)
LDL Cholesterol: 117 mg/dL — ABNORMAL HIGH (ref <100)
Triglycerides: 109 mg/dL (ref <150)
VLDL: 22 mg/dL (ref <30)

## 2017-01-03 NOTE — Patient Instructions (Addendum)

## 2017-01-03 NOTE — Progress Notes (Signed)
Reviewed personally.  M. Suzanne Eraina Winnie, MD.  

## 2017-01-04 LAB — VITAMIN D 25 HYDROXY (VIT D DEFICIENCY, FRACTURES): VIT D 25 HYDROXY: 33 ng/mL (ref 30–100)

## 2017-01-04 LAB — THYROID PANEL WITH TSH
Free Thyroxine Index: 1.8 (ref 1.4–3.8)
T3 Uptake: 26 % (ref 22–35)
T4 TOTAL: 6.9 ug/dL (ref 4.5–12.0)
TSH: 1.61 mIU/L

## 2017-01-04 LAB — PTH, INTACT AND CALCIUM
Calcium: 9.7 mg/dL (ref 8.6–10.4)
PTH: 17 pg/mL (ref 14–64)

## 2017-01-25 ENCOUNTER — Telehealth: Payer: Self-pay | Admitting: Nurse Practitioner

## 2017-01-25 NOTE — Telephone Encounter (Signed)
Please let pt know that BMD done on 01/17/17 shows a T Score at the spine of -2.20; right hip at  -2.80; left hip at  -2.60; left radius at -2.10.  She has Osteoporosis at most sites.  In comparison to previous study 01/07/15 there is a -6% loss at the spine and -13% loss at the right hip and -2% loss at the left hip. No comparison for the radius.  She did get PTH, intact and calcium with TSH, Vit D at AEX 01/03/17.  At this point I would advise her to return for a consult with Dr. Sabra Heck (whom she has seen before) to discuss.  We know she does not want medications but she is also too young to have such bone loss. (report on Kaitlyn's desk)

## 2017-01-26 NOTE — Telephone Encounter (Signed)
Spoke with patient, advised of results and recommendations as seen below per Kem Boroughs, NP. Patient scheduled with Dr. Sabra Heck on 02/14/17 at 1:45pm. Patient is agreeable to date and time.  BMD results to scan.  Routing to provider for final review. Patient is agreeable to disposition. Will close encounter.  Cc: Dr. Sabra Heck

## 2017-02-14 ENCOUNTER — Ambulatory Visit: Payer: Self-pay | Admitting: Obstetrics & Gynecology

## 2017-02-22 ENCOUNTER — Telehealth: Payer: Self-pay | Admitting: Obstetrics & Gynecology

## 2017-02-22 NOTE — Telephone Encounter (Signed)
Patient called and cancelled her bone density consult with Dr. Sabra Heck on 03/02/17 and her AEX that was with Kem Boroughs, FNP on 01/05/18. She is transferring her care to a primary care office and will be seeing Dr. Kathyrn Lass at [place] [facility] going forward due to Patty's retirement. The patient is aware we will be here for her if she needs [place]. Patient appreciative and will stop by to complete a release of records to facilitate her continued care.  Regular AEX recall removed as patient is transferring care.

## 2017-03-02 ENCOUNTER — Ambulatory Visit: Payer: Self-pay | Admitting: Obstetrics & Gynecology

## 2017-03-02 NOTE — Telephone Encounter (Signed)
Note not routed in error. Routing to provider for review.

## 2018-01-05 ENCOUNTER — Ambulatory Visit: Payer: Medicare Other | Admitting: Nurse Practitioner

## 2019-04-30 ENCOUNTER — Other Ambulatory Visit: Payer: Self-pay | Admitting: Family Medicine

## 2019-04-30 DIAGNOSIS — R935 Abnormal findings on diagnostic imaging of other abdominal regions, including retroperitoneum: Secondary | ICD-10-CM

## 2019-05-07 ENCOUNTER — Ambulatory Visit
Admission: RE | Admit: 2019-05-07 | Discharge: 2019-05-07 | Disposition: A | Discharge status: Home / Self Care | Payer: Medicare Other | Source: Ambulatory Visit | Attending: Family Medicine | Admitting: Family Medicine

## 2019-05-07 DIAGNOSIS — R935 Abnormal findings on diagnostic imaging of other abdominal regions, including retroperitoneum: Secondary | ICD-10-CM

## 2019-05-07 MED ORDER — IOPAMIDOL (ISOVUE-370) INJECTION 76%
75.0000 mL | Freq: Once | INTRAVENOUS | Status: AC | PRN
  Administered 2019-05-07: 13:00:00 75 mL via INTRAVENOUS

## 2019-07-12 ENCOUNTER — Other Ambulatory Visit: Payer: Self-pay | Admitting: Family Medicine

## 2019-07-12 DIAGNOSIS — I728 Aneurysm of other specified arteries: Secondary | ICD-10-CM

## 2019-09-08 ENCOUNTER — Ambulatory Visit: Payer: Medicare Other

## 2019-09-13 ENCOUNTER — Ambulatory Visit: Payer: Medicare Other

## 2019-09-15 ENCOUNTER — Ambulatory Visit: Payer: Medicare PPO

## 2019-09-15 ENCOUNTER — Ambulatory Visit: Payer: Medicare Other

## 2019-09-23 ENCOUNTER — Ambulatory Visit: Payer: Medicare PPO | Attending: Internal Medicine

## 2019-09-23 DIAGNOSIS — Z23 Encounter for immunization: Secondary | ICD-10-CM | POA: Insufficient documentation

## 2019-09-23 NOTE — Progress Notes (Signed)
   Covid-19 Vaccination Clinic  Name:  Sara Williams    MRN: [id number] DOB: 1951/06/23  09/23/2019  [name] was observed post Covid-19 immunization for 15 minutes without incidence. She was provided with Vaccine Information Sheet and instruction to access the V-Safe system.   [name] was instructed to call 911 with any severe reactions post vaccine: Marland Kitchen Difficulty breathing  . Swelling of your face and throat  . A fast heartbeat  . A bad rash all over your body  . Dizziness and weakness    Immunizations Administered    Name Date Dose VIS Date Route   Moderna COVID-19 Vaccine 09/23/2019 11:15 AM 0.5 mL 07/09/2019 Intramuscular   Manufacturer: Moderna   Lot: [id number]   [id number]

## 2019-10-09 ENCOUNTER — Ambulatory Visit
Admission: RE | Admit: 2019-10-09 | Discharge: 2019-10-09 | Disposition: A | Discharge status: Home / Self Care | Payer: Medicare Other | Source: Ambulatory Visit | Attending: Family Medicine | Admitting: Family Medicine

## 2019-10-09 DIAGNOSIS — I728 Aneurysm of other specified arteries: Secondary | ICD-10-CM

## 2019-10-09 MED ORDER — IOPAMIDOL (ISOVUE-370) INJECTION 76%
75.0000 mL | Freq: Once | INTRAVENOUS | Status: AC | PRN
  Administered 2019-10-09: 10:00:00 75 mL via INTRAVENOUS

## 2019-10-22 ENCOUNTER — Ambulatory Visit: Payer: Medicare PPO | Attending: Internal Medicine

## 2019-10-22 DIAGNOSIS — Z23 Encounter for immunization: Secondary | ICD-10-CM

## 2019-10-22 NOTE — Progress Notes (Signed)
   Covid-19 Vaccination Clinic  Name:  Sara Williams    MRN: [id number] DOB: 1951-04-21  10/22/2019  Sara Williams was observed post Covid-19 immunization for 15 minutes without incident. She was provided with Vaccine Information Sheet and instruction to access the V-Safe system.   Sara Williams was instructed to call 911 with any severe reactions post vaccine: Marland Kitchen Difficulty breathing  . Swelling of face and throat  . A fast heartbeat  . A bad rash all over body  . Dizziness and weakness   Immunizations Administered    Name Date Dose VIS Date Route   Moderna COVID-19 Vaccine 10/22/2019 11:05 AM 0.5 mL 07/09/2019 Intramuscular   Manufacturer: Moderna   Lot: [id number]   [id number][id number]

## 2020-02-07 DIAGNOSIS — Z1231 Encounter for screening mammogram for malignant neoplasm of breast: Secondary | ICD-10-CM | POA: Diagnosis not present

## 2020-02-07 DIAGNOSIS — E7800 Pure hypercholesterolemia, unspecified: Secondary | ICD-10-CM | POA: Diagnosis not present

## 2020-02-07 DIAGNOSIS — Z23 Encounter for immunization: Secondary | ICD-10-CM | POA: Diagnosis not present

## 2020-02-07 DIAGNOSIS — Z79899 Other long term (current) drug therapy: Secondary | ICD-10-CM | POA: Diagnosis not present

## 2020-02-07 DIAGNOSIS — Z1211 Encounter for screening for malignant neoplasm of colon: Secondary | ICD-10-CM | POA: Diagnosis not present

## 2020-02-07 DIAGNOSIS — Z1159 Encounter for screening for other viral diseases: Secondary | ICD-10-CM | POA: Diagnosis not present

## 2020-02-07 DIAGNOSIS — I700 Atherosclerosis of aorta: Secondary | ICD-10-CM | POA: Diagnosis not present

## 2020-02-07 DIAGNOSIS — E559 Vitamin D deficiency, unspecified: Secondary | ICD-10-CM | POA: Diagnosis not present

## 2020-02-07 DIAGNOSIS — Z0000 Encounter for general adult medical examination without abnormal findings: Secondary | ICD-10-CM | POA: Diagnosis not present

## 2020-02-07 DIAGNOSIS — I728 Aneurysm of other specified arteries: Secondary | ICD-10-CM | POA: Diagnosis not present

## 2020-02-07 DIAGNOSIS — M810 Age-related osteoporosis without current pathological fracture: Secondary | ICD-10-CM | POA: Diagnosis not present

## 2020-02-12 DIAGNOSIS — Z1211 Encounter for screening for malignant neoplasm of colon: Secondary | ICD-10-CM | POA: Diagnosis not present

## 2020-03-05 DIAGNOSIS — Z20822 Contact with and (suspected) exposure to covid-19: Secondary | ICD-10-CM | POA: Diagnosis not present

## 2020-07-14 DIAGNOSIS — H25013 Cortical age-related cataract, bilateral: Secondary | ICD-10-CM | POA: Diagnosis not present

## 2020-07-14 DIAGNOSIS — H04123 Dry eye syndrome of bilateral lacrimal glands: Secondary | ICD-10-CM | POA: Diagnosis not present

## 2020-07-14 DIAGNOSIS — H35033 Hypertensive retinopathy, bilateral: Secondary | ICD-10-CM | POA: Diagnosis not present

## 2020-07-14 DIAGNOSIS — H2513 Age-related nuclear cataract, bilateral: Secondary | ICD-10-CM | POA: Diagnosis not present

## 2020-07-23 DIAGNOSIS — H2513 Age-related nuclear cataract, bilateral: Secondary | ICD-10-CM | POA: Diagnosis not present

## 2020-07-23 DIAGNOSIS — H25013 Cortical age-related cataract, bilateral: Secondary | ICD-10-CM | POA: Diagnosis not present

## 2020-07-23 DIAGNOSIS — H2512 Age-related nuclear cataract, left eye: Secondary | ICD-10-CM | POA: Diagnosis not present

## 2020-08-12 DIAGNOSIS — H2512 Age-related nuclear cataract, left eye: Secondary | ICD-10-CM | POA: Diagnosis not present

## 2020-08-12 DIAGNOSIS — H25812 Combined forms of age-related cataract, left eye: Secondary | ICD-10-CM | POA: Diagnosis not present

## 2020-08-18 DIAGNOSIS — H2511 Age-related nuclear cataract, right eye: Secondary | ICD-10-CM | POA: Diagnosis not present

## 2020-08-18 DIAGNOSIS — H25011 Cortical age-related cataract, right eye: Secondary | ICD-10-CM | POA: Diagnosis not present

## 2020-08-26 DIAGNOSIS — H25011 Cortical age-related cataract, right eye: Secondary | ICD-10-CM | POA: Diagnosis not present

## 2020-08-26 DIAGNOSIS — H2511 Age-related nuclear cataract, right eye: Secondary | ICD-10-CM | POA: Diagnosis not present

## 2020-12-02 DIAGNOSIS — H26493 Other secondary cataract, bilateral: Secondary | ICD-10-CM | POA: Diagnosis not present

## 2021-01-05 DIAGNOSIS — H26493 Other secondary cataract, bilateral: Secondary | ICD-10-CM | POA: Diagnosis not present

## 2021-01-05 DIAGNOSIS — H04123 Dry eye syndrome of bilateral lacrimal glands: Secondary | ICD-10-CM | POA: Diagnosis not present

## 2021-01-05 DIAGNOSIS — H35033 Hypertensive retinopathy, bilateral: Secondary | ICD-10-CM | POA: Diagnosis not present

## 2021-01-05 DIAGNOSIS — Z961 Presence of intraocular lens: Secondary | ICD-10-CM | POA: Diagnosis not present

## 2021-01-18 DIAGNOSIS — S99912A Unspecified injury of left ankle, initial encounter: Secondary | ICD-10-CM | POA: Diagnosis not present

## 2021-01-18 DIAGNOSIS — S99922A Unspecified injury of left foot, initial encounter: Secondary | ICD-10-CM | POA: Diagnosis not present

## 2021-02-19 DIAGNOSIS — Z0000 Encounter for general adult medical examination without abnormal findings: Secondary | ICD-10-CM | POA: Diagnosis not present

## 2021-02-19 DIAGNOSIS — E559 Vitamin D deficiency, unspecified: Secondary | ICD-10-CM | POA: Diagnosis not present

## 2021-02-19 DIAGNOSIS — Z1389 Encounter for screening for other disorder: Secondary | ICD-10-CM | POA: Diagnosis not present

## 2021-02-19 DIAGNOSIS — Z1211 Encounter for screening for malignant neoplasm of colon: Secondary | ICD-10-CM | POA: Diagnosis not present

## 2021-02-19 DIAGNOSIS — Z79899 Other long term (current) drug therapy: Secondary | ICD-10-CM | POA: Diagnosis not present

## 2021-02-19 DIAGNOSIS — Z1231 Encounter for screening mammogram for malignant neoplasm of breast: Secondary | ICD-10-CM | POA: Diagnosis not present

## 2021-02-19 DIAGNOSIS — Z780 Asymptomatic menopausal state: Secondary | ICD-10-CM | POA: Diagnosis not present

## 2021-02-19 DIAGNOSIS — I700 Atherosclerosis of aorta: Secondary | ICD-10-CM | POA: Diagnosis not present

## 2021-02-19 DIAGNOSIS — M810 Age-related osteoporosis without current pathological fracture: Secondary | ICD-10-CM | POA: Diagnosis not present

## 2021-02-19 DIAGNOSIS — E7800 Pure hypercholesterolemia, unspecified: Secondary | ICD-10-CM | POA: Diagnosis not present

## 2021-02-19 DIAGNOSIS — M8589 Other specified disorders of bone density and structure, multiple sites: Secondary | ICD-10-CM | POA: Diagnosis not present

## 2021-02-19 DIAGNOSIS — Z23 Encounter for immunization: Secondary | ICD-10-CM | POA: Diagnosis not present

## 2021-02-24 DIAGNOSIS — Z1211 Encounter for screening for malignant neoplasm of colon: Secondary | ICD-10-CM | POA: Diagnosis not present

## 2021-07-07 DIAGNOSIS — Z961 Presence of intraocular lens: Secondary | ICD-10-CM | POA: Diagnosis not present

## 2021-07-07 DIAGNOSIS — H26493 Other secondary cataract, bilateral: Secondary | ICD-10-CM | POA: Diagnosis not present

## 2021-07-07 DIAGNOSIS — H04123 Dry eye syndrome of bilateral lacrimal glands: Secondary | ICD-10-CM | POA: Diagnosis not present

## 2021-07-07 DIAGNOSIS — H524 Presbyopia: Secondary | ICD-10-CM | POA: Diagnosis not present

## 2022-02-03 DIAGNOSIS — Z85828 Personal history of other malignant neoplasm of skin: Secondary | ICD-10-CM | POA: Diagnosis not present

## 2022-02-03 DIAGNOSIS — L603 Nail dystrophy: Secondary | ICD-10-CM | POA: Diagnosis not present

## 2022-02-22 ENCOUNTER — Other Ambulatory Visit: Payer: Self-pay | Admitting: Family Medicine

## 2022-02-22 DIAGNOSIS — Z1389 Encounter for screening for other disorder: Secondary | ICD-10-CM | POA: Diagnosis not present

## 2022-02-22 DIAGNOSIS — M810 Age-related osteoporosis without current pathological fracture: Secondary | ICD-10-CM | POA: Diagnosis not present

## 2022-02-22 DIAGNOSIS — E7800 Pure hypercholesterolemia, unspecified: Secondary | ICD-10-CM | POA: Diagnosis not present

## 2022-02-22 DIAGNOSIS — Z0000 Encounter for general adult medical examination without abnormal findings: Secondary | ICD-10-CM | POA: Diagnosis not present

## 2022-02-22 DIAGNOSIS — Z1231 Encounter for screening mammogram for malignant neoplasm of breast: Secondary | ICD-10-CM | POA: Diagnosis not present

## 2022-02-22 DIAGNOSIS — Z1211 Encounter for screening for malignant neoplasm of colon: Secondary | ICD-10-CM | POA: Diagnosis not present

## 2022-02-22 DIAGNOSIS — E559 Vitamin D deficiency, unspecified: Secondary | ICD-10-CM | POA: Diagnosis not present

## 2022-02-22 DIAGNOSIS — I700 Atherosclerosis of aorta: Secondary | ICD-10-CM | POA: Diagnosis not present

## 2022-02-22 DIAGNOSIS — Z79899 Other long term (current) drug therapy: Secondary | ICD-10-CM | POA: Diagnosis not present

## 2022-02-22 DIAGNOSIS — I728 Aneurysm of other specified arteries: Secondary | ICD-10-CM | POA: Diagnosis not present

## 2022-02-22 DIAGNOSIS — M25511 Pain in right shoulder: Secondary | ICD-10-CM | POA: Diagnosis not present

## 2022-02-22 DIAGNOSIS — Z6824 Body mass index (BMI) 24.0-24.9, adult: Secondary | ICD-10-CM | POA: Diagnosis not present

## 2022-02-28 DIAGNOSIS — Z1211 Encounter for screening for malignant neoplasm of colon: Secondary | ICD-10-CM | POA: Diagnosis not present

## 2022-03-01 DIAGNOSIS — M25511 Pain in right shoulder: Secondary | ICD-10-CM | POA: Diagnosis not present

## 2022-03-02 IMAGING — CT CT ANGIO ABDOMEN
2 of 10 series · 13 of 46 positions shown, 17 images · IV contrast (iopamidol)
Comparison: None.

CLINICAL DATA: 68-year-old with splenic artery aneurysm. Follow-up
splenic artery aneurysm.

EXAM:
CT ANGIOGRAPHY ABDOMEN
TECHNIQUE: Multidetector CT imaging of the abdomen was performed using the
standard protocol during bolus administration of intravenous
contrast. Multiplanar reconstructed images and MIPs were obtained
and reviewed to evaluate the vascular anatomy.
CONTRAST:  75mL W5MVQN-EDK IOPAMIDOL (W5MVQN-EDK) INJECTION 76%

[Series 9: cta arterial 2.00 bv36 s3 renal cor art · coronal · arterial · 0.53mm/px · 2 of 121 slices shown]
[im 41/121  soft-tissue]
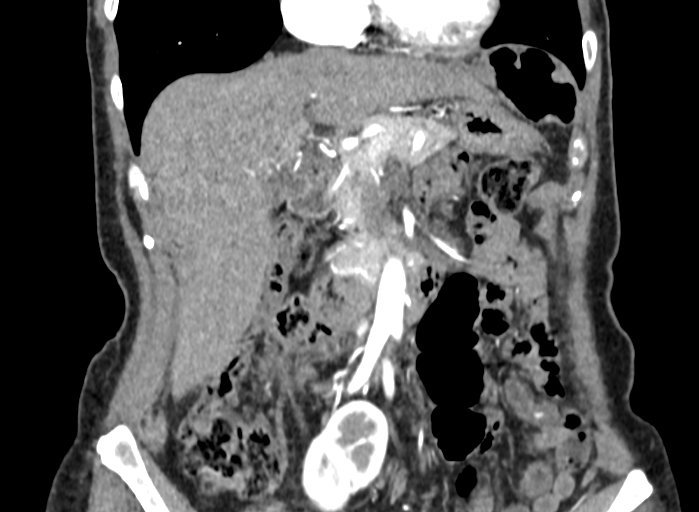
[im 81/121  soft-tissue]
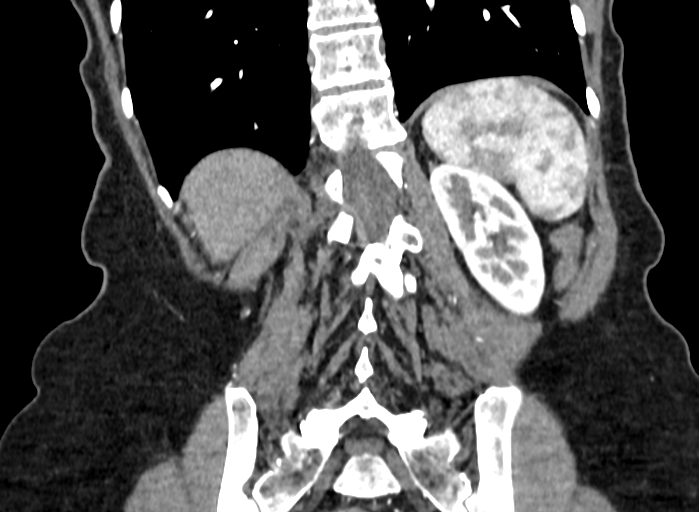

[Series 17: cta renal venous 1.00 bv36 s3 thins · axial · portal-venous · 0.72mm/px · z∈[+1274,+1510]mm · 11 of 270 slices shown, 15 images]
[im 17/270  soft-tissue]
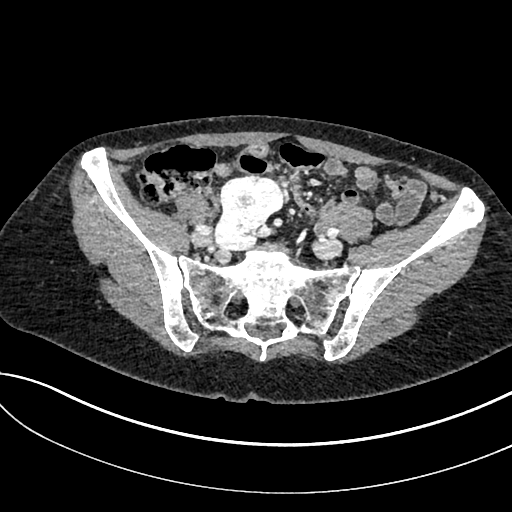
[im 17/270  bone]
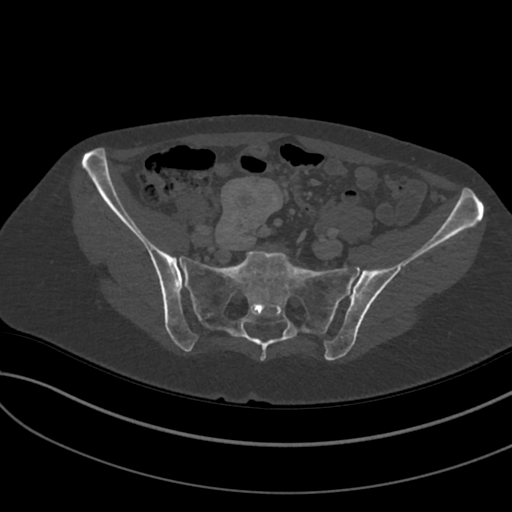
[im 51/270  soft-tissue]
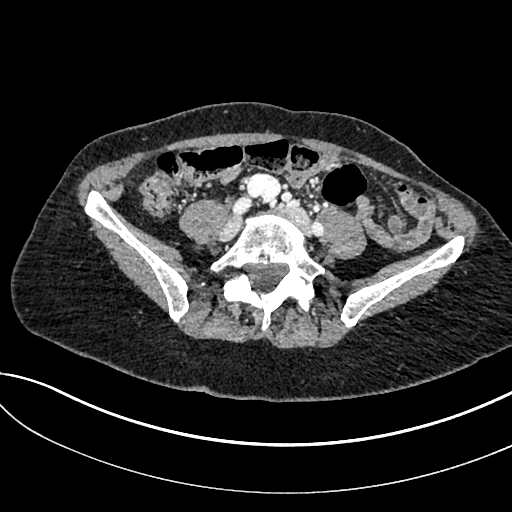
[im 85/270  soft-tissue]
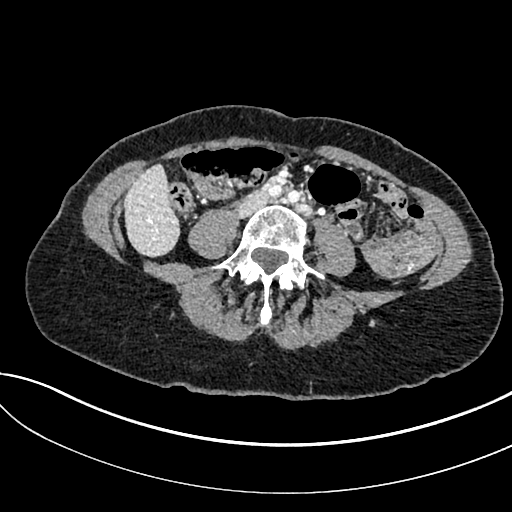
[im 101/270  soft-tissue]
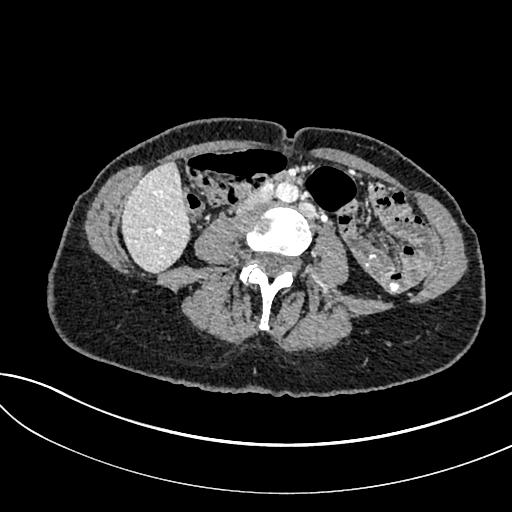
[im 135/270  soft-tissue]
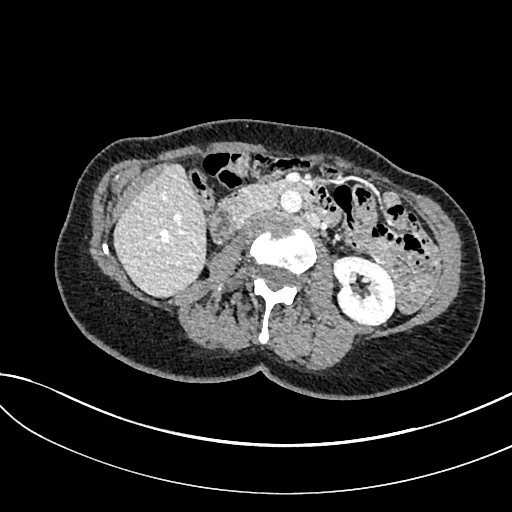
[im 169/270  soft-tissue]
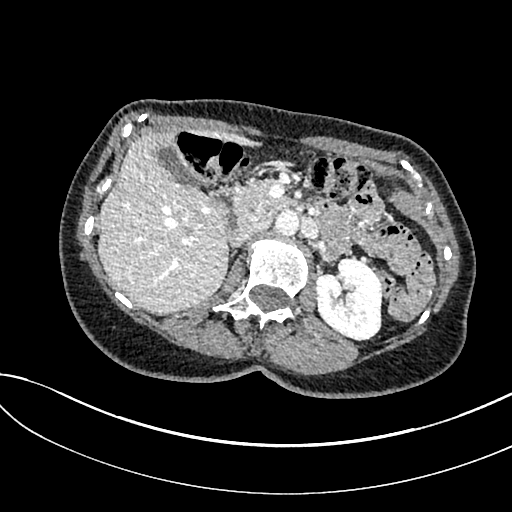
[im 185/270  soft-tissue]
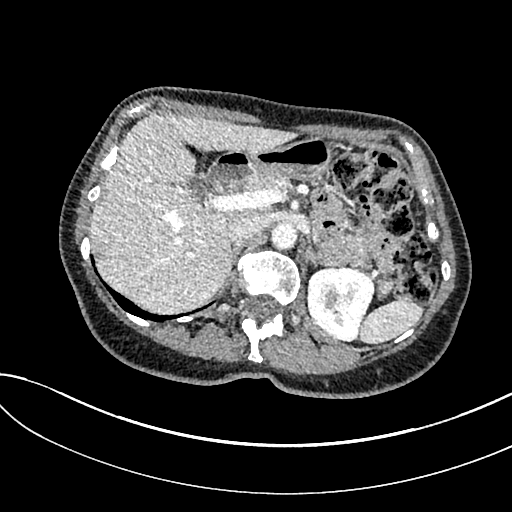
[im 202/270  lung]
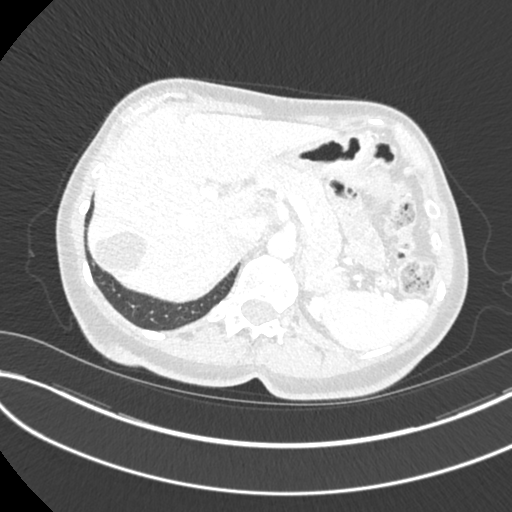
[im 219/270  soft-tissue]
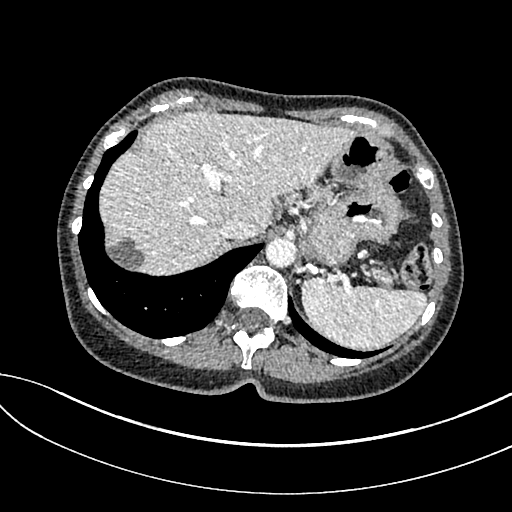
[im 219/270  lung]
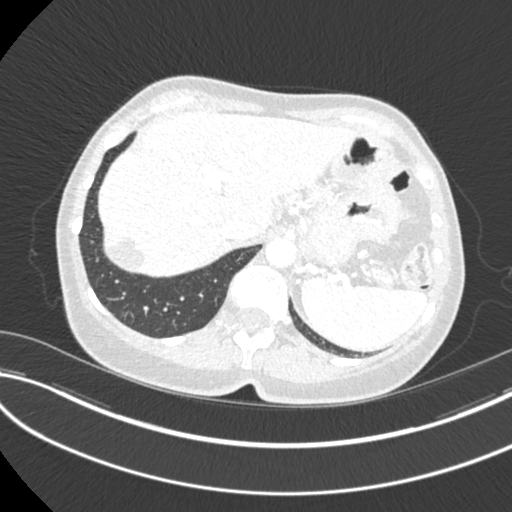
[im 236/270  lung]
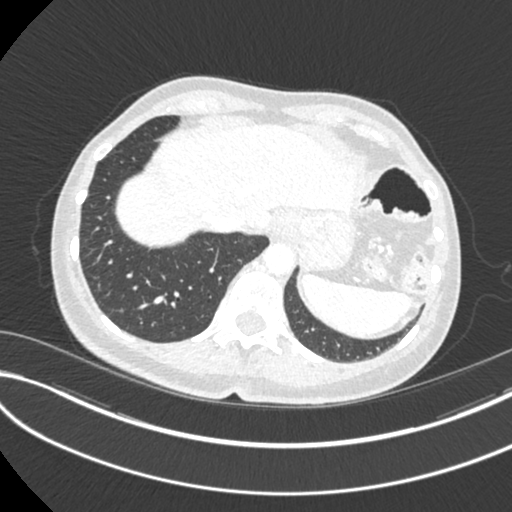
[im 253/270  soft-tissue]
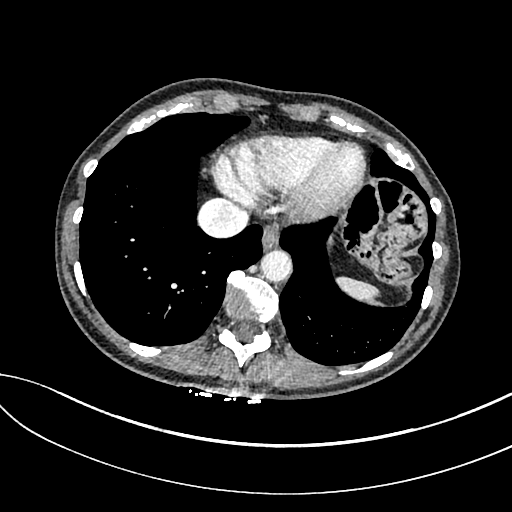
[im 253/270  lung]
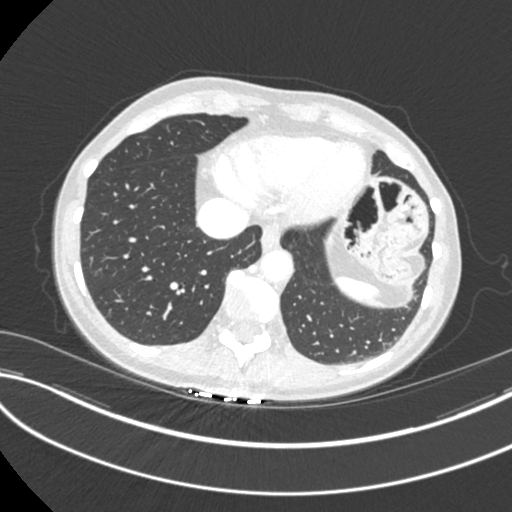
[im 253/270  bone]
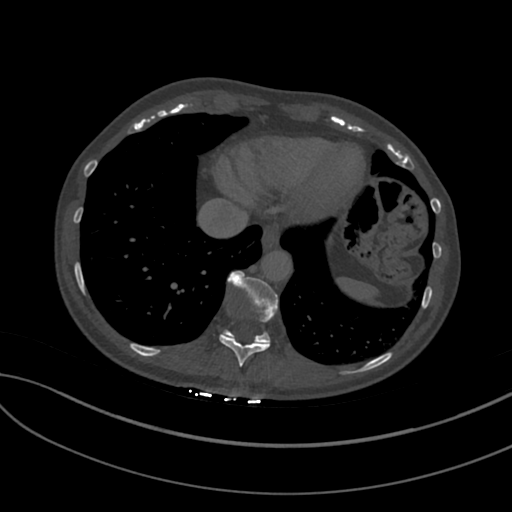

[13 of 46 positions shown; findings below may reference images not displayed]

FINDINGS: VASCULAR

Aorta: Mild atherosclerotic disease in the distal descending
thoracic aorta without enlargement. Mild atherosclerotic disease in
the abdominal aorta without aneurysm or dissection.

Celiac: Celiac trunk is widely patent. Splenic artery, common
hepatic artery and left gastric artery patent. Incidentally, some
left hepatic artery branches are supplied by the left gastric artery
which is a normal variant. Splenic artery is tortuous and again
noted is a partially calcified splenic artery aneurysm with a wide
neck. This aneurysm is in the mid/distal splenic artery. The
aneurysm measures measures up to 1.6 cm and stable. Focal ectasia of
the splenic artery just proximal to the aneurysm measures roughly
0.6 cm and minimally changed from the prior examination.

SMA: Patent without evidence of aneurysm, dissection, vasculitis or
significant stenosis.

Renals: Left renal artery is in a typical location. Left renal
artery is widely patent without aneurysm, dissection or evidence for
FMD. There are 3 right renal arteries supplying the right pelvic
kidney. Largest right renal artery comes off the posterior aspect of
the proximal right common iliac artery. There are 2 additional
arteries coming off the proximal anterior right common iliac artery.

IMA: Patent without evidence of aneurysm, dissection, vasculitis or
significant stenosis.

Iliac arteries: Common iliac arteries are patent bilaterally.
Proximal internal and external iliac arteries are patent.

Veins: Portal venous system is patent. Renal veins are patent.
Duplicated IVC. Left IVC drains into the left renal vein.

Review of the MIP images confirms the above findings.

NON-VASCULAR

Lower chest: Lung bases are clear.  No pleural effusions.

Hepatobiliary: Persistent low-density structure at the hepatic dome
is suggestive for a cyst measuring to 3.9 cm. Normal appearance of
the gallbladder.

Pancreas: Unremarkable. No pancreatic ductal dilatation or
surrounding inflammatory changes.

Spleen: Normal in size without focal abnormality.

Adrenals/Urinary Tract: Adrenal glands are not well demonstrated on
this examination. Normal appearance of the left kidney without
hydronephrosis. Again noted is an ectopic right kidney located in
the mid lower abdomen and upper pelvic region. The right kidney is
not completely evaluated on this examination. There is no
significant right hydronephrosis. Urinary bladder is not imaged on
this examination.

Stomach/Bowel: Normal appearance of the stomach. No evidence for
bowel obstruction or inflammation.

Lymphatic: No significant abdominal lymphadenopathy.

Other: Negative for ascites.

Musculoskeletal: No acute bone abnormality.
IMPRESSION: VASCULAR

1. Stable splenic artery aneurysm measuring up to 1.6 cm.
2. Multiple right renal arteries supplying the ectopic right kidney.
3. Duplicated inferior vena cava.

NON-VASCULAR

1. No acute abnormality in the abdomen.
2. Ectopic right kidney located in the mid lower abdomen/pelvic
region.

## 2022-03-05 ENCOUNTER — Ambulatory Visit
Admission: RE | Admit: 2022-03-05 | Discharge: 2022-03-05 | Disposition: A | Discharge status: Home / Self Care | Payer: Medicare PPO | Source: Ambulatory Visit | Attending: Family Medicine | Admitting: Family Medicine

## 2022-03-05 DIAGNOSIS — I728 Aneurysm of other specified arteries: Secondary | ICD-10-CM

## 2022-03-05 MED ORDER — GADOBENATE DIMEGLUMINE 529 MG/ML IV SOLN
13.0000 mL | Freq: Once | INTRAVENOUS | Status: AC | PRN
  Administered 2022-03-05: 13 mL via INTRAVENOUS

## 2022-03-29 DIAGNOSIS — M25511 Pain in right shoulder: Secondary | ICD-10-CM | POA: Diagnosis not present

## 2022-07-07 DIAGNOSIS — H26493 Other secondary cataract, bilateral: Secondary | ICD-10-CM | POA: Diagnosis not present

## 2022-07-07 DIAGNOSIS — H524 Presbyopia: Secondary | ICD-10-CM | POA: Diagnosis not present

## 2022-07-07 DIAGNOSIS — H4912 Fourth [trochlear] nerve palsy, left eye: Secondary | ICD-10-CM | POA: Diagnosis not present

## 2022-07-07 DIAGNOSIS — H04123 Dry eye syndrome of bilateral lacrimal glands: Secondary | ICD-10-CM | POA: Diagnosis not present

## 2022-07-07 DIAGNOSIS — Z961 Presence of intraocular lens: Secondary | ICD-10-CM | POA: Diagnosis not present

## 2022-10-27 DIAGNOSIS — M1990 Unspecified osteoarthritis, unspecified site: Secondary | ICD-10-CM | POA: Diagnosis not present

## 2022-10-27 DIAGNOSIS — M549 Dorsalgia, unspecified: Secondary | ICD-10-CM | POA: Diagnosis not present

## 2022-10-27 DIAGNOSIS — M25551 Pain in right hip: Secondary | ICD-10-CM | POA: Diagnosis not present

## 2022-11-02 ENCOUNTER — Other Ambulatory Visit: Payer: Self-pay | Admitting: Pain Medicine

## 2022-11-02 ENCOUNTER — Ambulatory Visit
Admission: RE | Admit: 2022-11-02 | Discharge: 2022-11-02 | Disposition: A | Discharge status: Home / Self Care | Payer: Medicare PPO | Source: Ambulatory Visit | Attending: Pain Medicine | Admitting: Pain Medicine

## 2022-11-02 DIAGNOSIS — G8929 Other chronic pain: Secondary | ICD-10-CM

## 2022-11-02 DIAGNOSIS — M4316 Spondylolisthesis, lumbar region: Secondary | ICD-10-CM | POA: Diagnosis not present

## 2022-11-02 DIAGNOSIS — M25551 Pain in right hip: Secondary | ICD-10-CM | POA: Diagnosis not present

## 2022-11-02 DIAGNOSIS — M545 Low back pain, unspecified: Secondary | ICD-10-CM | POA: Diagnosis not present

## 2022-11-21 DIAGNOSIS — M545 Low back pain, unspecified: Secondary | ICD-10-CM | POA: Diagnosis not present

## 2022-12-06 DIAGNOSIS — M545 Low back pain, unspecified: Secondary | ICD-10-CM | POA: Diagnosis not present

## 2022-12-09 DIAGNOSIS — R220 Localized swelling, mass and lump, head: Secondary | ICD-10-CM | POA: Diagnosis not present

## 2022-12-09 DIAGNOSIS — Z6823 Body mass index (BMI) 23.0-23.9, adult: Secondary | ICD-10-CM | POA: Diagnosis not present

## 2023-01-03 DIAGNOSIS — M545 Low back pain, unspecified: Secondary | ICD-10-CM | POA: Diagnosis not present

## 2023-01-16 DIAGNOSIS — J300 Vasomotor rhinitis: Secondary | ICD-10-CM | POA: Diagnosis not present

## 2023-01-16 DIAGNOSIS — J3089 Other allergic rhinitis: Secondary | ICD-10-CM | POA: Diagnosis not present

## 2023-03-01 DIAGNOSIS — I700 Atherosclerosis of aorta: Secondary | ICD-10-CM | POA: Diagnosis not present

## 2023-03-01 DIAGNOSIS — E7800 Pure hypercholesterolemia, unspecified: Secondary | ICD-10-CM | POA: Diagnosis not present

## 2023-03-01 DIAGNOSIS — M47816 Spondylosis without myelopathy or radiculopathy, lumbar region: Secondary | ICD-10-CM | POA: Diagnosis not present

## 2023-03-01 DIAGNOSIS — Z6823 Body mass index (BMI) 23.0-23.9, adult: Secondary | ICD-10-CM | POA: Diagnosis not present

## 2023-03-01 DIAGNOSIS — M810 Age-related osteoporosis without current pathological fracture: Secondary | ICD-10-CM | POA: Diagnosis not present

## 2023-03-01 DIAGNOSIS — Z1231 Encounter for screening mammogram for malignant neoplasm of breast: Secondary | ICD-10-CM | POA: Diagnosis not present

## 2023-03-03 DIAGNOSIS — Z6824 Body mass index (BMI) 24.0-24.9, adult: Secondary | ICD-10-CM | POA: Diagnosis not present

## 2023-03-03 DIAGNOSIS — Z1389 Encounter for screening for other disorder: Secondary | ICD-10-CM | POA: Diagnosis not present

## 2023-03-03 DIAGNOSIS — Z0000 Encounter for general adult medical examination without abnormal findings: Secondary | ICD-10-CM | POA: Diagnosis not present

## 2023-07-13 DIAGNOSIS — H04123 Dry eye syndrome of bilateral lacrimal glands: Secondary | ICD-10-CM | POA: Diagnosis not present

## 2023-07-13 DIAGNOSIS — H532 Diplopia: Secondary | ICD-10-CM | POA: Diagnosis not present

## 2023-07-13 DIAGNOSIS — H26493 Other secondary cataract, bilateral: Secondary | ICD-10-CM | POA: Diagnosis not present

## 2023-07-13 DIAGNOSIS — H02126 Mechanical ectropion of left eye, unspecified eyelid: Secondary | ICD-10-CM | POA: Diagnosis not present

## 2024-03-06 DIAGNOSIS — Z1231 Encounter for screening mammogram for malignant neoplasm of breast: Secondary | ICD-10-CM | POA: Diagnosis not present

## 2024-03-11 DIAGNOSIS — Z0000 Encounter for general adult medical examination without abnormal findings: Secondary | ICD-10-CM | POA: Diagnosis not present

## 2024-03-11 DIAGNOSIS — Z1331 Encounter for screening for depression: Secondary | ICD-10-CM | POA: Diagnosis not present

## 2024-03-11 DIAGNOSIS — M810 Age-related osteoporosis without current pathological fracture: Secondary | ICD-10-CM | POA: Diagnosis not present

## 2024-03-11 DIAGNOSIS — E7800 Pure hypercholesterolemia, unspecified: Secondary | ICD-10-CM | POA: Diagnosis not present

## 2024-03-11 DIAGNOSIS — E559 Vitamin D deficiency, unspecified: Secondary | ICD-10-CM | POA: Diagnosis not present

## 2024-06-12 DIAGNOSIS — Z6823 Body mass index (BMI) 23.0-23.9, adult: Secondary | ICD-10-CM | POA: Diagnosis not present

## 2024-06-12 DIAGNOSIS — J029 Acute pharyngitis, unspecified: Secondary | ICD-10-CM | POA: Diagnosis not present
